# Patient Record
Sex: Male | Born: 1965 | Race: White | Hispanic: No | Smoking: Never smoker
Health system: Southern US, Academic
[De-identification: ages and names within clinical notes are randomized; demographics above are authoritative.]

## PROBLEM LIST (undated history)

## (undated) DIAGNOSIS — G4730 Sleep apnea, unspecified: Secondary | ICD-10-CM

## (undated) DIAGNOSIS — Z973 Presence of spectacles and contact lenses: Secondary | ICD-10-CM

## (undated) DIAGNOSIS — I10 Essential (primary) hypertension: Secondary | ICD-10-CM

## (undated) DIAGNOSIS — Z77090 Contact with and (suspected) exposure to asbestos: Secondary | ICD-10-CM

## (undated) DIAGNOSIS — F4310 Post-traumatic stress disorder, unspecified: Secondary | ICD-10-CM

## (undated) DIAGNOSIS — Z9989 Dependence on other enabling machines and devices: Secondary | ICD-10-CM

## (undated) DIAGNOSIS — Z8669 Personal history of other diseases of the nervous system and sense organs: Secondary | ICD-10-CM

## (undated) DIAGNOSIS — E119 Type 2 diabetes mellitus without complications: Secondary | ICD-10-CM

## (undated) DIAGNOSIS — R011 Cardiac murmur, unspecified: Secondary | ICD-10-CM

## (undated) DIAGNOSIS — Z87448 Personal history of other diseases of urinary system: Secondary | ICD-10-CM

## (undated) DIAGNOSIS — C801 Malignant (primary) neoplasm, unspecified: Secondary | ICD-10-CM

## (undated) DIAGNOSIS — F419 Anxiety disorder, unspecified: Secondary | ICD-10-CM

## (undated) DIAGNOSIS — E785 Hyperlipidemia, unspecified: Secondary | ICD-10-CM

## (undated) HISTORY — DX: Post-traumatic stress disorder, unspecified: F43.10

## (undated) HISTORY — DX: Type 2 diabetes mellitus without complications: E11.9

## (undated) HISTORY — PX: HX CARPAL TUNNEL RELEASE: SHX101

## (undated) HISTORY — DX: Contact with and (suspected) exposure to asbestos: Z77.090

## (undated) HISTORY — PX: HX SHOULDER SURGERY: 2100001311

## (undated) HISTORY — DX: Essential (primary) hypertension: I10

## (undated) HISTORY — PX: KNEE SURGERY: SHX244

## (undated) HISTORY — PX: TESTICLE SURGERY: SHX794

## (undated) HISTORY — DX: Anxiety disorder, unspecified: F41.9

## (undated) HISTORY — PX: PROSTATE BIOPSY: SHX241

---

## 1993-12-08 ENCOUNTER — Emergency Department (HOSPITAL_COMMUNITY): Payer: Self-pay

## 2014-07-19 ENCOUNTER — Ambulatory Visit (INDEPENDENT_AMBULATORY_CARE_PROVIDER_SITE_OTHER): Payer: Self-pay | Admitting: OPHTHALMOLOGY

## 2014-08-02 ENCOUNTER — Ambulatory Visit (INDEPENDENT_AMBULATORY_CARE_PROVIDER_SITE_OTHER): Admitting: OPHTHALMOLOGY

## 2014-08-16 ENCOUNTER — Encounter (INDEPENDENT_AMBULATORY_CARE_PROVIDER_SITE_OTHER): Payer: Self-pay | Admitting: OPHTHALMOLOGY

## 2014-08-16 ENCOUNTER — Ambulatory Visit
Admission: RE | Admit: 2014-08-16 | Discharge: 2014-08-16 | Disposition: A | Discharge status: Home / Self Care | Payer: No Typology Code available for payment source | Source: Ambulatory Visit | Attending: Ophthalmology | Admitting: Ophthalmology

## 2014-08-16 ENCOUNTER — Ambulatory Visit (HOSPITAL_BASED_OUTPATIENT_CLINIC_OR_DEPARTMENT_OTHER): Payer: No Typology Code available for payment source | Admitting: OPHTHALMOLOGY

## 2014-08-16 DIAGNOSIS — H3589 Other specified retinal disorders: Secondary | ICD-10-CM | POA: Insufficient documentation

## 2014-08-16 DIAGNOSIS — S0510XA Contusion of eyeball and orbital tissues, unspecified eye, initial encounter: Secondary | ICD-10-CM | POA: Insufficient documentation

## 2014-08-16 DIAGNOSIS — H538 Other visual disturbances: Secondary | ICD-10-CM | POA: Insufficient documentation

## 2014-08-16 DIAGNOSIS — S0590XA Unspecified injury of unspecified eye and orbit, initial encounter: Secondary | ICD-10-CM

## 2014-08-16 DIAGNOSIS — H359 Unspecified retinal disorder: Secondary | ICD-10-CM

## 2014-08-16 DIAGNOSIS — I10 Essential (primary) hypertension: Secondary | ICD-10-CM | POA: Insufficient documentation
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
## 2014-08-19 ENCOUNTER — Ambulatory Visit (INDEPENDENT_AMBULATORY_CARE_PROVIDER_SITE_OTHER): Payer: Self-pay | Admitting: OPHTHALMOLOGY

## 2014-08-19 NOTE — Telephone Encounter (Signed)
Faxed chart note to [facility] regarding this pt  [phone]

## 2014-08-19 NOTE — Telephone Encounter (Signed)
-----   Message from Jones Bales sent at 08/18/2014 10:55 AM EDT -----  >> STEPHANIE GAMBLE 08/18/2014 10:55 AM  Dr Camillo Flaming pt                  Asher Muir states she sisnt get any paperwork back on this pt and needs to know what happened at  The appt on 8.31.15 please call

## 2015-03-18 ENCOUNTER — Ambulatory Visit (INDEPENDENT_AMBULATORY_CARE_PROVIDER_SITE_OTHER): Payer: Self-pay

## 2015-04-01 ENCOUNTER — Ambulatory Visit (INDEPENDENT_AMBULATORY_CARE_PROVIDER_SITE_OTHER): Payer: No Typology Code available for payment source

## 2015-06-10 ENCOUNTER — Ambulatory Visit (HOSPITAL_BASED_OUTPATIENT_CLINIC_OR_DEPARTMENT_OTHER): Payer: No Typology Code available for payment source

## 2015-06-10 ENCOUNTER — Ambulatory Visit
Admission: RE | Admit: 2015-06-10 | Discharge: 2015-06-10 | Disposition: A | Discharge status: Home / Self Care | Payer: No Typology Code available for payment source | Source: Ambulatory Visit

## 2015-06-10 ENCOUNTER — Encounter (INDEPENDENT_AMBULATORY_CARE_PROVIDER_SITE_OTHER): Payer: Self-pay

## 2015-06-10 ENCOUNTER — Ambulatory Visit (HOSPITAL_BASED_OUTPATIENT_CLINIC_OR_DEPARTMENT_OTHER)
Admission: RE | Admit: 2015-06-10 | Discharge: 2015-06-10 | Disposition: A | Discharge status: Home / Self Care | Payer: No Typology Code available for payment source | Source: Ambulatory Visit

## 2015-06-10 DIAGNOSIS — H4011X1 Primary open-angle glaucoma, mild stage: Secondary | ICD-10-CM

## 2015-06-10 DIAGNOSIS — Z87891 Personal history of nicotine dependence: Secondary | ICD-10-CM | POA: Insufficient documentation

## 2015-06-10 DIAGNOSIS — H40003 Preglaucoma, unspecified, bilateral: Secondary | ICD-10-CM

## 2015-06-10 DIAGNOSIS — I10 Essential (primary) hypertension: Secondary | ICD-10-CM | POA: Insufficient documentation

## 2015-06-10 DIAGNOSIS — H543 Unqualified visual loss, both eyes: Secondary | ICD-10-CM | POA: Insufficient documentation

## 2015-06-10 DIAGNOSIS — E119 Type 2 diabetes mellitus without complications: Secondary | ICD-10-CM | POA: Insufficient documentation

## 2015-06-10 MED ORDER — LATANOPROST 0.005 % EYE DROPS
1.00 [drp] | Freq: Every evening | OPHTHALMIC | Status: DC
Start: 2015-06-10 — End: 2023-04-01

## 2015-06-10 NOTE — Progress Notes (Addendum)
[facility]  Operated by [facility]  [address]  [place] [address]  Dept: [phone]    Patient Name: Christian Williamson  MRN#: [id number]  Birthdate: [birth year]    Date of Service: 06/10/2015    Chief Complaint     Decreased Vision          Christian Williamson is a 49 y.o. male who presents today for evaluation/consultation of:  HPI     Decreased Vision   In both eyes.           Comments   Pt states that he has intermittent blurry vision OS is worse. Pt states that he has been having high IOP and pain/pressure behind his eyes, OS worse. Pt states that her has a lot of floaters OS. Pt states that he is going to be getting new glasses but waiting till his BS get under control again. Family history of glaucoma- his mother and she had to use drops. Father was a diabetic and lost vision.        Last edited by Flavia Shipper, COA on 06/10/2015  9:22 AM. (History)        ROS     Positive for: Endocrine (Diabetes), Eyes (decreased vision)    Negative for: Constitutional, Gastrointestinal, Neurological, Skin, Genitourinary, Musculoskeletal, HENT, Cardiovascular, Respiratory, Psychiatric, Allergic/Imm, Heme/Lymph    Last edited by Flavia Shipper, COA on 06/10/2015  9:22 AM. (History)           Flavia Shipper, COA 06/10/2015, 09:28   MD Addition to HPI: Christian Williamson is here for a glaucoma evaluation.  He says there is a family history of glaucoma and diabetes.  He complains of blurry vision in his left eye that tends to get worse.  He also states he has high eye pressure behind his eye and seeing floaters, but the left eye is worse.      History reviewed. No pertinent past surgical history.        Past Medical History   Diagnosis Date    HTN (hypertension)     Diabetes            There is no problem list on file for this patient.      Family History:  Family History   Problem Relation Age of Onset    Glaucoma Mother     Diabetes Father     Blindness Father            Social History:     History        Social History    Marital Status: Divorced     Spouse Name: N/A    Number of Children: N/A    Years of Education: N/A     Social History Main Topics    Smoking status: Former Smoker    Smokeless tobacco: Never Used    Alcohol Use: No    Drug Use: Not on file    Sexual Activity: Not on file     Other Topics Concern    Not on file     Social History Narrative        History   Substance Use Topics    Smoking status: Former Smoker    Smokeless tobacco: Never Used    Alcohol Use: No            My Assessment:    ICD-10-CM    1. Glaucoma suspect, bilateral H40.003 OPH 3 ISOPTERS VF  OPH RNFL BI       Ophthalmic Plan of Care:  1. Glaucoma Suspect-  OS with questionable early glaucoma will add xalatan OS QPM.      I discussed the above diagnoses listed in the assessment and the above ophthalmic plan of care with the patient and patient's family.    Follow up:    I have asked Christian Williamson to follow up in 4-6 weeks IOP check         Documented chief complaint, history of present illness, allergies, review of systems, past medical, past surgical, family and social history were reviewed by me and when necessary I made changes to the technician note.  I also reviewed and agree with the findings documented in ophthalmology exam tab.  All questions were answered.      Orders Placed This Encounter   Procedures    OPH 3 ISOPTERS VF    OPH RNFL BI       No orders of the defined types were placed in this encounter.       I scribed a portion of the encounter including Chief Complaint, HPI, Impression, Plan, and exam elements excluding visual acuity, lensometry, pupil assessment, confrontational visual fields, tonometry results, extraocular motility assessment, refractometry results, and assessment of mood and orientation. I scribed this note at the request of the physician who personally performed the services documented.  Sula Rumple, SCRIBE 06/10/2015, 09:33      I have reviewed and confirmed the ROS, PFSH, and exam  elements performed and documented by the technician. The scribed portion of the progress note was scribed on my behalf and at my direction. I have reviewed and attest to the accuracy of the note.   Rolm Bookbinder, MD 06/10/2015, 10:46

## 2019-12-08 ENCOUNTER — Other Ambulatory Visit (HOSPITAL_COMMUNITY): Payer: Self-pay

## 2019-12-08 LAB — EXTERNAL COVID-19 MOLECULAR RESULT: External 2019-n-CoV/SARS-CoV-2: POSITIVE — AB

## 2020-05-12 ENCOUNTER — Other Ambulatory Visit (HOSPITAL_COMMUNITY): Payer: Self-pay

## 2020-05-12 DIAGNOSIS — R109 Unspecified abdominal pain: Secondary | ICD-10-CM

## 2020-06-10 ENCOUNTER — Other Ambulatory Visit: Payer: Self-pay

## 2020-06-10 ENCOUNTER — Ambulatory Visit
Admission: RE | Admit: 2020-06-10 | Discharge: 2020-06-10 | Disposition: A | Discharge status: Home / Self Care | Payer: No Typology Code available for payment source | Source: Ambulatory Visit

## 2020-06-10 DIAGNOSIS — R109 Unspecified abdominal pain: Secondary | ICD-10-CM

## 2023-02-05 ENCOUNTER — Encounter (INDEPENDENT_AMBULATORY_CARE_PROVIDER_SITE_OTHER): Payer: Self-pay | Admitting: Surgery

## 2023-02-12 ENCOUNTER — Encounter (INDEPENDENT_AMBULATORY_CARE_PROVIDER_SITE_OTHER): Payer: Self-pay | Admitting: Surgery

## 2023-02-12 ENCOUNTER — Other Ambulatory Visit: Payer: Self-pay

## 2023-02-12 ENCOUNTER — Ambulatory Visit (INDEPENDENT_AMBULATORY_CARE_PROVIDER_SITE_OTHER): Payer: 59 | Admitting: Surgery

## 2023-02-12 VITALS — BP 128/88 | HR 77 | Temp 98.2°F | Ht 66.0 in | Wt 178.8 lb

## 2023-02-12 DIAGNOSIS — K921 Melena: Secondary | ICD-10-CM

## 2023-02-12 MED ORDER — PEG 3350-ELECTROLYTES 236 GRAM-22.74 GRAM-6.74 GRAM-5.86 GRAM SOLUTION
4.0000 L | Freq: Once | ORAL | 0 refills | Status: DC
Start: 2023-02-12 — End: 2023-02-25

## 2023-02-12 NOTE — Progress Notes (Signed)
GENERAL SURGERY, [facility] GENERAL SURGERY  [address]  [place] [address]    History and Physical     Name: Christian Williamson MRN:  [id number]   Date: 02/12/2023 Age: 57 y.o.            Reason for Visit: Colonoscopy and EGD    History of Present Illness  Mr. Christian Williamson presents today for colonoscopy because of blood in stool.  The patient had a stool study performed which showed a positive fit test.  The patient denies any epigastric pain or reflux.  He states he used to take Nexium but no longer needs to and has had no problems with heartburn, indigestion or nausea.    Positive diabetes   Negative blood thinner    Of note is the fact that the patient also has elevated PSA for which he was seeing Urology.      Review of the result(s) of each unique test:  Patient underwent diagnostic testing ( none ) prior to this dates visit.  I have personally reviewed the results and that serves as a component of the medical decision making for this encounter       Review of prior external note(s) from each unique source:  Patients referral to this office including a recent assessment by the referring provider.  This was reviewed by me for this unique office visit for the indication and intent of the referral as well as any pertinent medical or surgical history relevant to the patients independent evaluation by me today.      Patient History  Past Medical History:   Diagnosis Date    Anxiety     Diabetes (CMS HCC)     Diabetes mellitus, type 2 (CMS HCC)     HTN (hypertension)     PTSD (post-traumatic stress disorder)          Past Surgical History:   Procedure Laterality Date    KNEE SURGERY      TESTICLE SURGERY           Current Outpatient Medications   Medication Sig    busPIRone (BUSPAR) 10 mg Oral Tablet Take 1 Tablet (10 mg total) by mouth Twice daily    empagliflozin (JARDIANCE) 25 mg Oral Tablet Take 1 Tablet (25 mg total) by mouth Once a day    gemfibrozil (LOPID) 600 mg Oral Tablet Take 1 Tablet  (600 mg total) by mouth Twice a day before meals    hydrochlorothiazide (MICROZIDE) 12.5 mg Oral Capsule Take 1 Capsule (12.5 mg total) by mouth Once a day    latanoprost (XALATAN) 0.005 % Ophthalmic Drops Instill 1 Drop into left eye Every evening    lisinopriL (PRINIVIL) 20 mg Oral Tablet Take 1 Tablet (20 mg total) by mouth Once a day    MetFORMIN (GLUCOPHAGE) 1,000 mg Oral Tablet Take 1 Tablet (1,000 mg total) by mouth Twice daily    metoprolol succinate (TOPROL-XL) 100 mg Oral Tablet Sustained Release 24 hr Take 1 Tablet (100 mg total) by mouth Once a day    mirtazapine (REMERON) 15 mg Oral Tablet 1 Tablet (15 mg total)    omeprazole (PRILOSEC) 20 mg Oral Capsule, Delayed Release(E.C.) Take 1 Capsule (20 mg total) by mouth Once a day    PEG 3350-Electrolytes 236-22.74-6.74 -5.86 gram Oral Recon Soln Take 4,000 mL by mouth One time for 1 dose    prazosin (MINIPRESS) 1 mg Oral Capsule 1 Capsule (1 mg total)    ranitidine (  ZANTAC) 15 mg/mL Oral Syrup Take 150 mg by mouth Twice daily (Patient not taking: Reported on 02/12/2023)    tamsulosin (FLOMAX) 0.4 mg Oral Capsule 1 Capsule (0.4 mg total)    Venlafaxine (EFFEXOR) 100 mg Oral Tablet Take 1 Tablet (100 mg total) by mouth Three times a day     No Known Allergies  Family Medical History:       Problem Relation (Age of Onset)    Blindness Father    Cancer Other    Diabetes Father, Other    Glaucoma Mother            Social History     Tobacco Use    Smoking status: Former    Smokeless tobacco: Never   Substance Use Topics    Alcohol use: No    Drug use: Never            Physical Examination:  Vitals:    02/12/23 1453   BP: 128/88   Pulse: 77   Temp: 36.8 C (98.2 F)   Weight: 81.1 kg (178 lb 12.8 oz)   Height: 1.676 m ('5\' 6"'$ )   BMI: 28.92        General: appropriate for age. in no acute distress.    Vital signs are present above and have been reviewed by me     HEENT: Atraumatic, Normocephalic. PERRLA. EOMI. Nose clear. Throat clear    Lungs: Nonlabored breathing  with symmetric expansion. Clear to auscultation bilaterally    Heart:Regular wth respect to rate and rythmn.    Abdomen:Soft. Nontender. Nondistended and benign    Extremities: Grossly normal. No major deformities     Neuro:  Grossly normal motor and sensory function    Psychiatric: Alert and oriented to person, place, and time. affect appropriate      Assessment and Plan  Colonoscopy because of blood in stool/positive fit test scheduled for 02/25/2023 at 9:00 a.m.      Follow Up:  No follow-ups on file.      ICD-10-CM    1. Blood in stool  K92.1           Brees Hounshell B Shataria Crist, MD ,MBA,FACS    I appreciate the opportunity to be involved in the care of your patients.  If you have any questions or concerns regarding this encounter, please do not hesitate to contact me at your convenience.      This note may have been partially generated using MModal Fluency Direct system, and there may be some incorrect words, spellings, and punctuation that were not noted in checking the note before saving, though effort was made to avoid such errors.

## 2023-02-15 ENCOUNTER — Other Ambulatory Visit: Payer: Self-pay

## 2023-02-15 ENCOUNTER — Ambulatory Visit (INDEPENDENT_AMBULATORY_CARE_PROVIDER_SITE_OTHER): Payer: 59 | Admitting: UROLOGY

## 2023-02-15 ENCOUNTER — Encounter (INDEPENDENT_AMBULATORY_CARE_PROVIDER_SITE_OTHER): Payer: Self-pay | Admitting: UROLOGY

## 2023-02-15 VITALS — BP 134/77 | HR 75 | Ht 66.0 in | Wt 179.0 lb

## 2023-02-15 DIAGNOSIS — R972 Elevated prostate specific antigen [PSA]: Secondary | ICD-10-CM

## 2023-02-15 DIAGNOSIS — N401 Enlarged prostate with lower urinary tract symptoms: Secondary | ICD-10-CM

## 2023-02-15 DIAGNOSIS — R3911 Hesitancy of micturition: Secondary | ICD-10-CM

## 2023-02-15 NOTE — Progress Notes (Signed)
[facility]                                                                        Urology Clinic       Williamson, Christian Scholze, 57 y.o. male  Encounter Start Date:  (Not on file)  Date of Service:  02/15/2023  Date of Birth:  June 09, 1966    PCP: Trevor Mace, MD.    Information Obtained from: patient  Chief Complaint:  Elevated PSA       HPI:    [name] is a 57 y.o., White male who presents with elevated PSA seen at the request of his primary care physician.  Patient does use Flomax for BPH symptoms.  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 [facility] documents  2. Discontinue Flomax for a trial he states it has not helped him when he has missed a dose  3. Obtain an MRI of the prostate with basic metabolic panel  4. Follow up in 3-4 months for re-evaluation          [name], DO

## 2023-02-25 ENCOUNTER — Ambulatory Visit (HOSPITAL_COMMUNITY): Payer: 59 | Admitting: Surgery

## 2023-02-25 ENCOUNTER — Encounter (HOSPITAL_COMMUNITY)
Admission: RE | Disposition: A | Discharge status: Home / Self Care | Payer: Self-pay | Source: Home / Self Care | Attending: Surgery

## 2023-02-25 ENCOUNTER — Other Ambulatory Visit: Payer: Self-pay

## 2023-02-25 ENCOUNTER — Ambulatory Visit
Admission: RE | Admit: 2023-02-25 | Discharge: 2023-02-25 | Disposition: A | Discharge status: Home / Self Care | Payer: 59 | Attending: Surgery | Admitting: Surgery

## 2023-02-25 ENCOUNTER — Ambulatory Visit (HOSPITAL_COMMUNITY): Payer: 59 | Admitting: Certified Registered"

## 2023-02-25 ENCOUNTER — Encounter (HOSPITAL_COMMUNITY): Payer: Self-pay | Admitting: Surgery

## 2023-02-25 DIAGNOSIS — G4730 Sleep apnea, unspecified: Secondary | ICD-10-CM | POA: Insufficient documentation

## 2023-02-25 DIAGNOSIS — F419 Anxiety disorder, unspecified: Secondary | ICD-10-CM | POA: Insufficient documentation

## 2023-02-25 DIAGNOSIS — E119 Type 2 diabetes mellitus without complications: Secondary | ICD-10-CM | POA: Insufficient documentation

## 2023-02-25 DIAGNOSIS — I10 Essential (primary) hypertension: Secondary | ICD-10-CM | POA: Insufficient documentation

## 2023-02-25 DIAGNOSIS — K641 Second degree hemorrhoids: Secondary | ICD-10-CM | POA: Insufficient documentation

## 2023-02-25 DIAGNOSIS — K5730 Diverticulosis of large intestine without perforation or abscess without bleeding: Secondary | ICD-10-CM | POA: Insufficient documentation

## 2023-02-25 DIAGNOSIS — Z87891 Personal history of nicotine dependence: Secondary | ICD-10-CM | POA: Insufficient documentation

## 2023-02-25 DIAGNOSIS — F4310 Post-traumatic stress disorder, unspecified: Secondary | ICD-10-CM | POA: Insufficient documentation

## 2023-02-25 DIAGNOSIS — K921 Melena: Secondary | ICD-10-CM | POA: Insufficient documentation

## 2023-02-25 SURGERY — COLONOSCOPY
Anesthesia: General | Wound class: Clean Contaminated Wounds-The respiratory, GI, Genital, or urinary

## 2023-02-25 MED ORDER — PROPOFOL 10 MG/ML INTRAVENOUS EMULSION
Freq: Once | INTRAVENOUS | Status: DC | PRN
Start: 2023-02-25 — End: 2023-02-25
  Administered 2023-02-25: 30 mL via INTRAVENOUS

## 2023-02-25 MED ORDER — DEXTROSE 5 % AND LACTATED RINGERS INTRAVENOUS SOLUTION
INTRAVENOUS | Status: DC | PRN
Start: 2023-02-25 — End: 2023-02-25

## 2023-02-25 NOTE — Discharge Instructions (Addendum)
SURGICAL DISCHARGE INSTRUCTIONS     Dr. Renette Butters, Gene B, MD  performed your COLONOSCOPY today at the [facility]:  Monday through Friday from 8 a.m. - 4 p.m.: [phone]    For T&D: [phone]  Between 4 p.m. - 8 a.m., weekends and holidays:  Call ER [phone]    PLEASE SEE WRITTEN HANDOUTS AS DISCUSSED BY YOUR NURSE    SIGNS AND SYMPTOMS OF A WOUND / INCISION INFECTION   Be sure to watch for the following:  Increase in redness or red streaks near or around the wound or incision.  Increase in pain that is intense or severe and cannot be relieved by the pain medication that your doctor has given you.  Increase in swelling that cannot be relieved by elevation of a body part, or by applying ice, if permitted.  Increase in drainage, or if yellow / green in color and smells bad. This could be on a dressing or a cast.  Increase in fever for longer than 24 hours, or an increase that is higher than 101 degrees Fahrenheit (normal body temperature is 98 degrees Fahrenheit). The incision may feel warm to the touch.    **CALL YOUR DOCTOR IF ONE OR MORE OF THESE SIGNS / SYMPTOMS SHOULD OCCUR.    ANESTHESIA INFORMATION   ANESTHESIA -- ADULT PATIENTS:  You have received intravenous sedation / general anesthesia, and you may feel drowsy and light-headed for several hours. You may even experience some forgetfulness of the procedure. DO NOT DRIVE A MOTOR VEHICLE or perform any activity requiring complete alertness or coordination until you feel fully awake in about 24-48 hours. Do not drink alcoholic beverages for at least 24 hours. Do not stay alone, you must have a responsible adult available to be with you. You may also experience a dry mouth or nausea for 24 hours. This is a normal side effect and will disappear as the effects of the medication wear off.    REMEMBER   If you experience any difficulty breathing, chest pain, bleeding that you feel is excessive,  persistent nausea or vomiting or for any other concerns:  Call your physician Dr.  Renette Butters, Bruna Potter, MD   at [phone] . You may also ask to have the general doctor on call paged. They are available to you 24 hours a day.      SPECIAL INSTRUCTIONS / COMMENTS   FINDINGS: moderate sigmoid diverticulosis, tethering of sigmoid colon, scattered diverticulosis throughout, grade 2 hemorrhoids     TODAY, NO DRIVING, COOKING, CLEANING OR SIGNING LEGAL DOCUMENTS. RETURN TO NORMAL ACTIVITIES TOMORROW  CONTINUE HOME MEDS AND EAT A REGULAR DIET TODAY      FOLLOW-UP APPOINTMENTS   Please call your surgeon's office at the number listed to schedule a date / time of return for follow-up.     Dr Laqueta Due  [phone]

## 2023-02-25 NOTE — Anesthesia Postprocedure Evaluation (Signed)
Anesthesia Post Op Evaluation    Patient: Christian Williamson  Procedure(s):  COLONOSCOPY    Last Vitals:Temperature: 36.8 C (98.2 F) (02/25/23 1012)  Heart Rate: 74 (02/25/23 1012)  BP (Non-Invasive): (!) 92/58 (02/25/23 1012)  Respiratory Rate: 16 (02/25/23 1012)  SpO2: 100 % (02/25/23 1012)    No notable events documented.    Patient is sufficiently recovered from the effects of anesthesia to participate in the evaluation and has returned to their pre-procedure level.  Patient location during evaluation: PACU       Patient participation: complete - patient participated  Level of consciousness: awake and alert and responsive to verbal stimuli    Pain score: 0  Pain management: adequate  Airway patency: patent    Anesthetic complications: no  Cardiovascular status: acceptable  Respiratory status: acceptable  Hydration status: acceptable  Patient post-procedure temperature: Pt Normothermic   PONV Status: Absent

## 2023-02-25 NOTE — OR Surgeon (Signed)
[facility]      Patient Name: Christian Williamson, Christian Williamson Number: [id number]  Date of Service: 02/25/2023   Date of Birth: 1966-12-06      Pre-Operative Diagnosis: BLOOD IN STOOL     Post-Operative Diagnosis: moderate sigmoid diverticulosis, tethering of sigmoid colon, scattered diverticulosis throughout, grade 2 hemorrhoids    Procedure(s)/Description:  COLONOSCOPY: [id number] (CPT)     Attending Surgeon: Willow Ora, MD     Anesthesia:  CRNA: Carmina Miller, CRNA    Anesthesia Type: .General       The patient indicates that they have read and understood the preoperative colonoscopy consent form. The benefits, risks and alternatives to the procedure were discussed. I specifically discussed the risk of bleeding and/or perforation requiring operation.The patient indicates they have no further question and wish to proceed. Informed consent was obtained from the patient and/or medical power of attorney.    The patient was brought into the procedure room and placed on the table in the left lateral decubitus position. After IV sedation was given, full finger digital rectal examination was performed with a circumferential sweep of the distal rectal mucosa. Subsequently, the flexible colonoscope was inserted into the rectum and passed without any difficulty. The colonoscope was then advanced up into the sigmoid colon, descending colon, transverse colon, right colon and cecum without any difficulty. Gross examination of each section of the colon was performed. Cecal intubation was achieved and the appendiceal orifice and ileocecal valve were identified. The operative findings of diverticulosis were noted as described above. The colonoscope was withdrawn carefully examining the mucosa as the scope was being extracted with particular attention paid to the proximal sides of folds, flexures, bends and rectal valves. At approximately 10 cm. from the anal verge, the colonoscope was retroflexed to fully  examine the distal rectum. The colonoscope was removed and a repeat digital rectal examination was performed at the completion of the procedure. The patient tolerated the procedure well. No intraoperative complications were encountered.    EKG, pulse, pulse oximetry and blood pressure were monitored throughout the entire procedure.    There were no unplanned events.    The patient was instructed to contact me if they have any problems with their colon such as bleeding, pain or changes in bowel habits. They understood and agreed to do so.    The patient will not need another screening colonoscopy for 10 years which is according to ASGE guidelines. However, if in the future the patient has any problems with abdominal pain, changes in bowel habits, blood in stool, etc., then they should contact me because they may be a candidate for diagnostic colonoscopy before the 10 year time limit.      Takeela Peil B. Harper Vandervoort, MD, MBA, [facility]

## 2023-02-25 NOTE — H&P (Signed)
[facility]  General Surgery  History and Physical    Date of Service:  02/25/2023  Christian Williamson, Christian Williamson, 57 y.o. male  Date of Admission:  02/25/2023  Date of Birth:  1966/09/28  PCP: [facility]    Reason for admission:  Colonoscopy    HPI:  Christian Williamson is a 57 y.o. White male who is admitted for BLOOD IN STOOL   Mr. Mcmeans presents today for colonoscopy because of blood in stool.  The patient had a stool study performed which showed a positive fit test.  The patient denies any epigastric pain or reflux.  He states he used to take Nexium but no longer needs to and has had no problems with heartburn, indigestion or nausea.     Positive diabetes   Negative blood thinner     Of note is the fact that the patient also has elevated PSA for which he was seeing Urology.        Review of the result(s) of each unique test:  Patient underwent diagnostic testing ( none ) prior to this dates visit.  I have personally reviewed the results and that serves as a component of the medical decision making for this encounter        Review of prior external note(s) from each unique source:  Patients referral to this office including a recent assessment by the referring provider.  This was reviewed by me for this unique office visit for the indication and intent of the referral as well as any pertinent medical or surgical history relevant to the patients independent evaluation by me today.      Past Medical History:   Diagnosis Date    Anxiety     Diabetes (CMS HCC)     Diabetes mellitus, type 2 (CMS HCC)     HTN (hypertension)     PTSD (post-traumatic stress disorder)       Past Surgical History:   Procedure Laterality Date    KNEE SURGERY      TESTICLE SURGERY        Social History     Tobacco Use    Smoking status: Former    Smokeless tobacco: Never   Substance Use Topics    Alcohol use: No    Drug use: Never       Family Medical History:       Problem Relation (Age of Onset)    Blindness Father     Cancer Other    Diabetes Father, Other    Glaucoma Mother           Medications Prior to Admission       Prescriptions    busPIRone (BUSPAR) 10 mg Oral Tablet    Take 1 Tablet (10 mg total) by mouth Twice daily    empagliflozin (JARDIANCE) 25 mg Oral Tablet    Take 1 Tablet (25 mg total) by mouth Once a day    gemfibrozil (LOPID) 600 mg Oral Tablet    Take 1 Tablet (600 mg total) by mouth Twice a day before meals    hydrochlorothiazide (MICROZIDE) 12.5 mg Oral Capsule    Take 1 Capsule (12.5 mg total) by mouth Once a day    latanoprost (XALATAN) 0.005 % Ophthalmic Drops    Instill 1 Drop into left eye Every evening    lisinopriL (PRINIVIL) 20 mg Oral Tablet    Take 1 Tablet (20 mg total) by mouth Once a day    MetFORMIN (GLUCOPHAGE)  1,000 mg Oral Tablet    Take 1 Tablet (1,000 mg total) by mouth Twice daily    metoprolol succinate (TOPROL-XL) 100 mg Oral Tablet Sustained Release 24 hr    Take 1 Tablet (100 mg total) by mouth Once a day    mirtazapine (REMERON) 15 mg Oral Tablet    1 Tablet (15 mg total)    omeprazole (PRILOSEC) 20 mg Oral Capsule, Delayed Release(E.C.)    Take 1 Capsule (20 mg total) by mouth Once a day    prazosin (MINIPRESS) 1 mg Oral Capsule    1 Capsule (1 mg total)    tamsulosin (FLOMAX) 0.4 mg Oral Capsule    1 Capsule (0.4 mg total)    Venlafaxine (EFFEXOR) 100 mg Oral Tablet    Take 1 Tablet (100 mg total) by mouth Three times a day           No Known Allergies       Patient Vitals for the past 24 hrs:   BP Temp Pulse Resp SpO2 Height Weight   02/25/23 0700 131/73 36.7 C (98 F) 72 18 99 % 1.651 m ('5\' 5"'$ ) 80.7 kg (178 lb)          General: appropriate for age. in no acute distress.    Vital signs are present above and have been reviewed by me     HEENT: Atraumatic, Normocephalic. PERRLA, EOMI. Nose clear. Throat clear.    Lungs: Nonlabored breathing with symmetric expansion.  Clear to auscultation bilaterally    Heart:Regular wth respect to rate and rythmn.    Abdomen:Soft. Nontender.  Nondistended and benign    Extremities:  Grossly normal with good range of motion and no major deformities.    Neuro:  Grossly normal motor and sensory function. CN's II through XII intact.    Psychiatric: Alert and oriented to person, place, and time. affect appropriate    Laboratory Data:     No results found for any visits on 02/25/23 (from the past 24 hour(s)).    Imaging Studies:    No orders to display        Assessment/Plan:  BLOOD IN STOOL    Colonoscopy scheduled for Monday February 25, 2023    Discussed indications, risks, and benefits of colonoscopy with possible biopsy/polypectomy with the patient.  Discussed the possibility of polypectomy, biopsies, and possible repeat examinations.  Risks include bleeding, sedation risks, possibility of missed diagnosis of polyp or malignancy, and remote possibilities of perforation and death.  All questions were answered, and informed consent was clearly obtained.    This note was partially created using voice recognition software and is inherently subject to errors including those of syntax and "sound alike " substitutions which may escape proof reading. In such instances, original meaning may be extrapolated by contextual derivation.    Willow Ora, MD, MBA, FACS

## 2023-02-25 NOTE — Anesthesia Preprocedure Evaluation (Signed)
ANESTHESIA PRE-OP EVALUATION  Planned Procedure: COLONOSCOPY  Review of Systems     anesthesia history negative               Pulmonary   sleep apnea, CPAP and past history of smoking ,   Cardiovascular    Hypertension ,No peripheral edema,  Exercise Tolerance: > or = 4 METS   ,beta blocker therapy  ,taken in last 24 hours     GI/Hepatic/Renal   negative GI/hepatic/renal ROS,         Endo/Other    no obesity   type 2 diabetes    Neuro/Psych/MS    PTSD, anxiety     Cancer    negative hematology/oncology ROS,                     Physical Assessment      Airway       Mallampati: III    TM distance: >3 FB    Neck ROM: full  Mouth Opening: fair.  Facial hair  Beard        Dental           (+) poor dentition           Pulmonary    Breath sounds clear to auscultation  (-) no rhonchi, no decreased breath sounds, no wheezes, no rales and no stridor     Cardiovascular    Rhythm: regular  Rate: Normal  (-) no friction rub, carotid bruit is not present, no peripheral edema and no murmur     Other findings              Plan  ASA 2     Planned anesthesia type: general     total intravenous anesthesia                          Anesthetic plan and risks discussed with patient  signed consent obtained          Patient's NPO status is appropriate for Anesthesia.

## 2023-03-01 ENCOUNTER — Ambulatory Visit
Admission: RE | Admit: 2023-03-01 | Discharge: 2023-03-01 | Disposition: A | Discharge status: Home / Self Care | Payer: 59 | Source: Ambulatory Visit | Attending: UROLOGY | Admitting: UROLOGY

## 2023-03-01 ENCOUNTER — Other Ambulatory Visit: Payer: Self-pay

## 2023-03-01 DIAGNOSIS — R972 Elevated prostate specific antigen [PSA]: Secondary | ICD-10-CM | POA: Insufficient documentation

## 2023-03-01 MED ORDER — GADOBUTROL 10 MMOL/10 ML (1 MMOL/ML) INTRAVENOUS SOLUTION
10.0000 mL | INTRAVENOUS | Status: AC
Start: 2023-03-01 — End: 2023-03-01
  Administered 2023-03-01: 8 mL via INTRAVENOUS

## 2023-03-04 DIAGNOSIS — R972 Elevated prostate specific antigen [PSA]: Secondary | ICD-10-CM

## 2023-03-14 ENCOUNTER — Encounter (INDEPENDENT_AMBULATORY_CARE_PROVIDER_SITE_OTHER): Payer: Self-pay | Admitting: UROLOGY

## 2023-03-14 ENCOUNTER — Other Ambulatory Visit (INDEPENDENT_AMBULATORY_CARE_PROVIDER_SITE_OTHER): Payer: Self-pay | Admitting: Student in an Organized Health Care Education/Training Program

## 2023-03-14 ENCOUNTER — Other Ambulatory Visit: Payer: Self-pay

## 2023-03-14 ENCOUNTER — Ambulatory Visit (INDEPENDENT_AMBULATORY_CARE_PROVIDER_SITE_OTHER): Payer: 59 | Admitting: UROLOGY

## 2023-03-14 VITALS — BP 129/68 | HR 73 | Ht 66.0 in | Wt 174.0 lb

## 2023-03-14 DIAGNOSIS — Z01818 Encounter for other preprocedural examination: Secondary | ICD-10-CM

## 2023-03-14 DIAGNOSIS — R972 Elevated prostate specific antigen [PSA]: Secondary | ICD-10-CM

## 2023-03-14 DIAGNOSIS — R935 Abnormal findings on diagnostic imaging of other abdominal regions, including retroperitoneum: Secondary | ICD-10-CM | POA: Insufficient documentation

## 2023-03-14 MED ORDER — CIPROFLOXACIN 500 MG TABLET
500.0000 mg | ORAL_TABLET | Freq: Two times a day (BID) | ORAL | 0 refills | Status: DC
Start: 2023-03-14 — End: 2023-05-01

## 2023-03-14 NOTE — Progress Notes (Signed)
[facility]                                                                        Urology Clinic       Pun, [name], 57 y.o. male  Encounter Start Date:  (Not on file)  Date of Service:  03/14/2023  Date of Birth:  September 15, 1966    PCP: [facility].    Information Obtained from: patient  Chief Complaint:  Elevated PSA       HPI:    Christian Williamson is a 57 y.o., White male who presents with seen for an elevated PSA of 8.9 from the [facility].  His prior PSA was approximately 3 ng.  Patient discontinued his Flomax stated it did not help him anyway.  Patient has undergone an MRI of the prostate here for continued evaluation           Impression:     Elevated PSA 8.9  Abnormal MRI PI-RADS 5  32 g prostate      Recommendations:      1. Reviewed MRI peripheral zone a PI-RADS 5 recommend fusion biopsy discussed possible complications bleeding infection needing secondary surgeries and diagnosis of malignancy  2. Preoperative antibiotics and urine culture prior  3. Patient discontinued the Flomax states he is voiding adequately but the Minipress/prednisone does have some BPH characteristics which could be helping him as well  4. scheduled for a fusion biopsy follow up for next          [name], DO

## 2023-03-18 ENCOUNTER — Ambulatory Visit (HOSPITAL_BASED_OUTPATIENT_CLINIC_OR_DEPARTMENT_OTHER)
Admission: RE | Admit: 2023-03-18 | Discharge: 2023-03-18 | Disposition: A | Discharge status: Home / Self Care | Payer: 59 | Source: Ambulatory Visit | Attending: Student in an Organized Health Care Education/Training Program | Admitting: Student in an Organized Health Care Education/Training Program

## 2023-03-18 ENCOUNTER — Other Ambulatory Visit: Payer: Self-pay

## 2023-03-18 ENCOUNTER — Ambulatory Visit: Payer: 59 | Attending: Student in an Organized Health Care Education/Training Program

## 2023-03-18 DIAGNOSIS — R972 Elevated prostate specific antigen [PSA]: Secondary | ICD-10-CM | POA: Insufficient documentation

## 2023-03-18 DIAGNOSIS — Z01818 Encounter for other preprocedural examination: Secondary | ICD-10-CM | POA: Insufficient documentation

## 2023-03-18 LAB — BASIC METABOLIC PANEL
ANION GAP: 7 mmol/L (ref 4–13)
BUN/CREA RATIO: 14 (ref 6–22)
BUN: 13 mg/dL (ref 7–25)
CALCIUM: 9.1 mg/dL (ref 8.6–10.3)
CHLORIDE: 105 mmol/L (ref 98–107)
CO2 TOTAL: 25 mmol/L (ref 21–31)
CREATININE: 0.94 mg/dL (ref 0.60–1.30)
ESTIMATED GFR: 93 mL/min/{1.73_m2} (ref >59)
GLUCOSE: 122 mg/dL — ABNORMAL HIGH (ref 74–109)
OSMOLALITY, CALCULATED: 275 mOsm/kg (ref 270–290)
POTASSIUM: 3.6 mmol/L (ref 3.5–5.1)
SODIUM: 137 mmol/L (ref 136–145)

## 2023-03-18 LAB — URINALYSIS, MACROSCOPIC
BILIRUBIN: NEGATIVE mg/dL
BLOOD: NEGATIVE mg/dL
GLUCOSE: 300 mg/dL — AB
KETONES: NEGATIVE mg/dL
LEUKOCYTES: NEGATIVE WBCs/uL
NITRITE: NEGATIVE
PH: 6 (ref 5.0–9.0)
PROTEIN: NEGATIVE mg/dL
SPECIFIC GRAVITY: 1.02 (ref 1.002–1.030)
UROBILINOGEN: NORMAL mg/dL

## 2023-03-18 LAB — URINALYSIS, MICROSCOPIC
RBCS: <1 /hpf (ref <4)
SQUAMOUS EPITHELIAL: <1 /hpf (ref <28)

## 2023-03-18 LAB — CBC
HCT: 41.5 % (ref 36.7–47.1)
HGB: 14.5 g/dL (ref 12.5–16.3)
MCH: 28.2 pg (ref 23.8–33.4)
MCHC: 34.9 g/dL (ref 32.5–36.3)
MCV: 81 fL (ref 73.0–96.2)
MPV: 9.5 fL (ref 7.4–11.4)
PLATELETS: 261 10*3/uL (ref 140–440)
RBC: 5.13 10*6/uL (ref 4.06–5.63)
RDW: 13.5 % (ref 12.1–16.2)
WBC: 7.4 10*3/uL (ref 3.6–10.2)

## 2023-03-19 DIAGNOSIS — Z01810 Encounter for preprocedural cardiovascular examination: Secondary | ICD-10-CM

## 2023-03-19 LAB — ECG 12 LEAD
Atrial Rate: 82 {beats}/min
Calculated P Axis: 3 degrees
Calculated R Axis: 63 degrees
Calculated T Axis: 32 degrees
PR Interval: 128 ms
QRS Duration: 88 ms
QT Interval: 384 ms
QTC Calculation: 448 ms
Ventricular rate: 82 {beats}/min

## 2023-04-01 ENCOUNTER — Encounter (HOSPITAL_COMMUNITY)
Admission: RE | Disposition: A | Discharge status: Home / Self Care | Payer: Self-pay | Source: Ambulatory Visit | Attending: Student in an Organized Health Care Education/Training Program

## 2023-04-01 ENCOUNTER — Ambulatory Visit (HOSPITAL_COMMUNITY): Payer: 59 | Admitting: Anesthesiology

## 2023-04-01 ENCOUNTER — Inpatient Hospital Stay
Admission: RE | Admit: 2023-04-01 | Discharge: 2023-04-01 | Disposition: A | Discharge status: Home / Self Care | Payer: 59 | Source: Ambulatory Visit | Attending: Student in an Organized Health Care Education/Training Program | Admitting: Student in an Organized Health Care Education/Training Program

## 2023-04-01 ENCOUNTER — Other Ambulatory Visit: Payer: Self-pay

## 2023-04-01 ENCOUNTER — Encounter (HOSPITAL_COMMUNITY): Payer: Self-pay | Admitting: Student in an Organized Health Care Education/Training Program

## 2023-04-01 DIAGNOSIS — G4730 Sleep apnea, unspecified: Secondary | ICD-10-CM | POA: Insufficient documentation

## 2023-04-01 DIAGNOSIS — Z79899 Other long term (current) drug therapy: Secondary | ICD-10-CM | POA: Insufficient documentation

## 2023-04-01 DIAGNOSIS — F4310 Post-traumatic stress disorder, unspecified: Secondary | ICD-10-CM | POA: Insufficient documentation

## 2023-04-01 DIAGNOSIS — I10 Essential (primary) hypertension: Secondary | ICD-10-CM | POA: Insufficient documentation

## 2023-04-01 DIAGNOSIS — Z87891 Personal history of nicotine dependence: Secondary | ICD-10-CM | POA: Insufficient documentation

## 2023-04-01 DIAGNOSIS — C61 Malignant neoplasm of prostate: Secondary | ICD-10-CM | POA: Insufficient documentation

## 2023-04-01 DIAGNOSIS — E119 Type 2 diabetes mellitus without complications: Secondary | ICD-10-CM | POA: Insufficient documentation

## 2023-04-01 DIAGNOSIS — F419 Anxiety disorder, unspecified: Secondary | ICD-10-CM | POA: Insufficient documentation

## 2023-04-01 LAB — POC BLOOD GLUCOSE (RESULTS): GLUCOSE, POC: 124 mg/dl — ABNORMAL HIGH (ref 70–100)

## 2023-04-01 SURGERY — BIOPSY PROSTATE TRANSURETHRAL
Anesthesia: Monitor Anesthesia Care | Site: Prostate

## 2023-04-01 MED ORDER — LACTATED RINGERS INTRAVENOUS SOLUTION
INTRAVENOUS | Status: DC
Start: 2023-04-01 — End: 2023-04-01

## 2023-04-01 MED ORDER — FENTANYL (PF) 50 MCG/ML INJECTION WRAPPER
25.0000 ug | INJECTION | INTRAMUSCULAR | Status: DC | PRN
Start: 2023-04-01 — End: 2023-04-01

## 2023-04-01 MED ORDER — SODIUM CHLORIDE 0.9 % (FLUSH) INJECTION SYRINGE
3.0000 mL | INJECTION | INTRAMUSCULAR | Status: DC | PRN
Start: 2023-04-01 — End: 2023-04-01

## 2023-04-01 MED ORDER — LIDOCAINE HCL 10 MG/ML (1 %) INJECTION SOLUTION
Freq: Once | INTRAMUSCULAR | Status: DC | PRN
Start: 2023-04-01 — End: 2023-04-01
  Administered 2023-04-01: 2 mL via INTRAMUSCULAR

## 2023-04-01 MED ORDER — ONDANSETRON HCL (PF) 4 MG/2 ML INJECTION SOLUTION
INTRAMUSCULAR | Status: AC
Start: 2023-04-01 — End: 2023-04-01
  Filled 2023-04-01: qty 2

## 2023-04-01 MED ORDER — MIDAZOLAM 5 MG/ML INJECTION WRAPPER
Freq: Once | INTRAMUSCULAR | Status: DC | PRN
Start: 2023-04-01 — End: 2023-04-01
  Administered 2023-04-01 (×2): 2.5 mg via INTRAVENOUS

## 2023-04-01 MED ORDER — ONDANSETRON HCL (PF) 4 MG/2 ML INJECTION SOLUTION
4.0000 mg | Freq: Once | INTRAMUSCULAR | Status: AC
Start: 2023-04-01 — End: 2023-04-01
  Administered 2023-04-01: 4 mg via INTRAVENOUS

## 2023-04-01 MED ORDER — FENTANYL (PF) 50 MCG/ML INJECTION SOLUTION
INTRAMUSCULAR | Status: AC
Start: 2023-04-01 — End: 2023-04-01
  Filled 2023-04-01: qty 2

## 2023-04-01 MED ORDER — ONDANSETRON HCL (PF) 4 MG/2 ML INJECTION SOLUTION
4.0000 mg | Freq: Once | INTRAMUSCULAR | Status: DC | PRN
Start: 2023-04-01 — End: 2023-04-01

## 2023-04-01 MED ORDER — DEXAMETHASONE SODIUM PHOSPHATE 4 MG/ML INJECTION SOLUTION
4.0000 mg | Freq: Once | INTRAMUSCULAR | Status: AC
Start: 2023-04-01 — End: 2023-04-01
  Administered 2023-04-01: 4 mg via INTRAVENOUS

## 2023-04-01 MED ORDER — IPRATROPIUM 0.5 MG-ALBUTEROL 3 MG (2.5 MG BASE)/3 ML NEBULIZATION SOLN
3.0000 mL | INHALATION_SOLUTION | Freq: Once | RESPIRATORY_TRACT | Status: DC | PRN
Start: 2023-04-01 — End: 2023-04-01

## 2023-04-01 MED ORDER — LIDOCAINE (PF) 100 MG/5 ML (2 %) INTRAVENOUS SYRINGE
INJECTION | Freq: Once | INTRAVENOUS | Status: DC | PRN
Start: 2023-04-01 — End: 2023-04-01
  Administered 2023-04-01: 100 mg via INTRAVENOUS

## 2023-04-01 MED ORDER — ONDANSETRON HCL (PF) 4 MG/2 ML INJECTION SOLUTION
4.0000 mg | Freq: Three times a day (TID) | INTRAMUSCULAR | Status: DC | PRN
Start: 2023-04-01 — End: 2023-04-01

## 2023-04-01 MED ORDER — HYDROCODONE 5 MG-ACETAMINOPHEN 325 MG TABLET
1.0000 | ORAL_TABLET | ORAL | Status: DC | PRN
Start: 2023-04-01 — End: 2023-04-01

## 2023-04-01 MED ORDER — SODIUM CHLORIDE 0.9 % (FLUSH) INJECTION SYRINGE
3.0000 mL | INJECTION | Freq: Three times a day (TID) | INTRAMUSCULAR | Status: DC
Start: 2023-04-01 — End: 2023-04-01

## 2023-04-01 MED ORDER — GENTAMICIN IV - PHARMACIST TO DOSE PER PROTOCOL
Freq: Every day | Status: DC | PRN
Start: 2023-04-01 — End: 2023-04-01

## 2023-04-01 MED ORDER — MIDAZOLAM 5 MG/ML INJECTION WRAPPER
INTRAMUSCULAR | Status: AC
Start: 2023-04-01 — End: 2023-04-01
  Filled 2023-04-01: qty 1

## 2023-04-01 MED ORDER — PROPOFOL 10 MG/ML IV BOLUS
INJECTION | Freq: Once | INTRAVENOUS | Status: DC | PRN
Start: 2023-04-01 — End: 2023-04-01
  Administered 2023-04-01 (×2): 20 mg via INTRAVENOUS

## 2023-04-01 MED ORDER — FAMOTIDINE (PF) 20 MG/2 ML INTRAVENOUS SOLUTION
20.0000 mg | Freq: Once | INTRAVENOUS | Status: AC
Start: 2023-04-01 — End: 2023-04-01
  Administered 2023-04-01: 20 mg via INTRAVENOUS

## 2023-04-01 MED ORDER — SODIUM CHLORIDE 0.9 % INTRAVENOUS SOLUTION
5.0000 mg/kg | Freq: Once | INTRAVENOUS | Status: DC
Start: 2023-04-01 — End: 2023-04-01
  Filled 2023-04-01: qty 10

## 2023-04-01 MED ORDER — FENTANYL (PF) 50 MCG/ML INJECTION WRAPPER
50.0000 ug | INJECTION | INTRAMUSCULAR | Status: DC | PRN
Start: 2023-04-01 — End: 2023-04-01

## 2023-04-01 MED ORDER — MIDAZOLAM 5 MG/ML INJECTION WRAPPER
1.0000 mg | Freq: Once | INTRAMUSCULAR | Status: DC | PRN
Start: 2023-04-01 — End: 2023-04-01
  Administered 2023-04-01: 1 mg via INTRAVENOUS

## 2023-04-01 MED ORDER — ALBUTEROL SULFATE 2.5 MG/3 ML (0.083 %) SOLUTION FOR NEBULIZATION
2.5000 mg | INHALATION_SOLUTION | Freq: Once | RESPIRATORY_TRACT | Status: DC | PRN
Start: 2023-04-01 — End: 2023-04-01

## 2023-04-01 MED ORDER — MORPHINE 4 MG/ML INJECTION WRAPPER
1.0000 mg | INJECTION | INTRAMUSCULAR | Status: DC | PRN
Start: 2023-04-01 — End: 2023-04-01

## 2023-04-01 MED ORDER — DEXMEDETOMIDINE 100 MCG/ML INTRAVENOUS SOLUTION
INTRAVENOUS | Status: AC
Start: 2023-04-01 — End: 2023-04-01
  Filled 2023-04-01: qty 2

## 2023-04-01 MED ORDER — DEXAMETHASONE SODIUM PHOSPHATE 4 MG/ML INJECTION SOLUTION
INTRAMUSCULAR | Status: AC
Start: 2023-04-01 — End: 2023-04-01
  Filled 2023-04-01: qty 1

## 2023-04-01 MED ORDER — FENTANYL (PF) 50 MCG/ML INJECTION WRAPPER
INJECTION | Freq: Once | INTRAMUSCULAR | Status: DC | PRN
Start: 2023-04-01 — End: 2023-04-01
  Administered 2023-04-01 (×2): 50 ug via INTRAVENOUS

## 2023-04-01 MED ORDER — FAMOTIDINE (PF) 20 MG/2 ML INTRAVENOUS SOLUTION
INTRAVENOUS | Status: AC
Start: 2023-04-01 — End: 2023-04-01
  Filled 2023-04-01: qty 2

## 2023-04-01 MED ORDER — LIDOCAINE HCL 10 MG/ML (1 %) INJECTION SOLUTION
INTRAMUSCULAR | Status: AC
Start: 2023-04-01 — End: 2023-04-01
  Filled 2023-04-01: qty 20

## 2023-04-01 SURGICAL SUPPLY — 29 items
CONTAINR HISTO C90ML 10% NEUT BF FRMLN POLYPROP PREFL 60ML (SPECIMEN COLLECTION SUPPLIES) ×12 IMPLANT
CONV USE 31829 - NEEDLE HYPO  18GA 1.5IN STD REG BVL LF (MED SURG SUPPLIES) ×1 IMPLANT
COVER PROBE 8X1IN NONST LF  ECVT (MED SURG SUPPLIES) ×1
DISCONTINUED USE ITEM 340762 - COVER PROBE 8X1IN NONST LF  ECVT (MED SURG SUPPLIES) ×1 IMPLANT
GLOVE SURG 6 LF  PF BEAD CUF STRL CRM 11.3IN PROTEXIS PLISPRN THK9.1 MIL (GLOVES AND ACCESSORIES) IMPLANT
GLOVE SURG 6 LF  PF SMOOTH BEAD CUF INTLK STRL BLU 11.3IN PROTEXIS NEU-THERA PLISPRN THK7.9 MIL (GLOVES AND ACCESSORIES) IMPLANT
GLOVE SURG 6.5 LF  PF BEAD CUF STRL CRM 11.3IN PROTEXIS PI PLISPRN THK9.1 MIL (GLOVES AND ACCESSORIES) IMPLANT
GLOVE SURG 6.5 LF  PF SMOOTH BEAD CUF INTLK STRL BLU 11.3IN PROTEXIS NEU-THERA PLISPRN THK7.9 MIL (GLOVES AND ACCESSORIES) IMPLANT
GLOVE SURG 6.5 LTX PF SMOOTH BEAD CUF STRL YW 11.5IN PROTEXIS NEU-THERA DDRGL THK8.7 MIL (GLOVES AND ACCESSORIES) IMPLANT
GLOVE SURG 7 LF  PF BEAD CUF STRL CRM 11.8IN PROTEXIS PI PLISPRN THK9.1 MIL (GLOVES AND ACCESSORIES) IMPLANT
GLOVE SURG 7 LF  PF SMOOTH BEAD CUF INTLK STRL BLU 11.8IN PROTEXIS NEU-THERA PLISPRN THK7.9 MIL (GLOVES AND ACCESSORIES) IMPLANT
GLOVE SURG 7 LTX PF SMOOTH BEAD CUF STRL YW 12IN PROTEXIS NEU-THERA DDRGL THK8.7 MIL (GLOVES AND ACCESSORIES) IMPLANT
GLOVE SURG 7.5 LF  PF BEAD CUF STRL CRM 11.8IN PROTEXIS PI PLISPRN THK9.1 MIL (GLOVES AND ACCESSORIES) ×1 IMPLANT
GLOVE SURG 7.5 LF  PF SMOOTH BEAD CUF INTLK STRL BLU 11.8IN PROTEXIS NEU-THERA PLISPRN THK7.9 MIL (GLOVES AND ACCESSORIES) IMPLANT
GLOVE SURG 7.5 LTX PF SMOOTH BEAD CUF STRL YW 12IN PROTEXIS (GLOVES AND ACCESSORIES) IMPLANT
GLOVE SURG 8 LF  PF BEAD CUF STRL CRM 11.8IN PROTEXIS PI PLISPRN THK9.1 MIL (GLOVES AND ACCESSORIES) IMPLANT
GLOVE SURG 8 LF  PF SMOOTH BEAD CUF INTLK STRL BLU 11.8IN PROTEXIS NEU-THERA PLISPRN THK7.9 MIL (GLOVES AND ACCESSORIES) IMPLANT
GLOVE SURG 8.5 LF  PF BEAD CUF STRL CRM 11.8IN PROTEXIS PI PLISPRN THK9.1 MIL (GLOVES AND ACCESSORIES) IMPLANT
GOWN SURG 2XL STD LGTH REG L3 NONREINFORCE BRTHBL TWL STRL LF  DISP BLU HALYARD SPECTRUM SMS (DRAPE/PACKS/SHEETS/OR TOWEL) ×1 IMPLANT
GOWN SURG XL STD LGTH L3 NONREINFORCE HKLP CLSR TWL STRL LF  DISP BLU SPECTRUM SMS (DRAPE/PACKS/SHEETS/OR TOWEL) ×1
GOWN SURG XL STD LGTH L3 NONREINFORCE HKLP CLSR TWL STRL LF DISP BLU SPECTRUM SMS (DRAPE/PACKS/SHEETS/OR TOWEL) ×1 IMPLANT
GUIDE NEEDLE 1.6MM BIOPSY BPL STRL DISP LF  TRANSDUC 8818 8808E (MED SURG SUPPLIES) ×1 IMPLANT
INSTR BIOPSY PNK 25CM 18GA MXCOR 22MM ANG 2 TRGR UL SHRP TIP BVL 13.8CM STRL LF  DISP (MED SURG SUPPLIES) ×1 IMPLANT
LABEL MED CORRECT MED LABELING SYS 4 FLG 2 SHEET 24 PRPRNT STRL (MED SURG SUPPLIES) ×1 IMPLANT
NEEDLE SPINAL BLK 7IN 22GA QUINCKE LONG LGTH REG WL POLYPROP STRL LF  DISP (MED SURG SUPPLIES) ×1 IMPLANT
PAD DRESS 8X3IN MDCHC NONADH NWVN LF  STRL DISP WHT (WOUND CARE SUPPLY) ×1 IMPLANT
SOL IRRG 0.9% NACL 1000ML PLASTIC PR BTL ISTNC N-PYRG STRL LF (MEDICATIONS/SOLUTIONS) ×1 IMPLANT
SYRINGE LL 10ML LF  STRL GRAD N-PYRG DEHP-FR PVC FREE MED DISP (MED SURG SUPPLIES) ×1 IMPLANT
TOWEL 24X16IN COTTON BLU DISP SURG STRL LF (DRAPE/PACKS/SHEETS/OR TOWEL) ×1 IMPLANT

## 2023-04-01 NOTE — OR Surgeon (Signed)
[facility]                                              OPERATIVE NOTE    Patient Name: Christian Williamson, Christian Williamson Number: [id number]  Date of Service: 04/01/2023   Date of Birth: [birth year]    All elements must be documented.    Pre-Operative Diagnosis:ePSA   Post-Operative Diagnosis:same  Procedure(s)/Description:MRI-US fusion transrectal prostate biopsy  Findings: 32cc volume; two PI-RADS 5 lesions posterolateral (bilateral)    Patient was prepped and draped in sterile fashion.  A well lubricated and fire transrectal ultrasound probe was inserted into the rectum.  Using the Uro now software the MRI and ultrasound images were fused together by creating a sweep from the seminal vesicles to the prostatic apex.  Following this there was 2 PI-RADS 5 lesions in the posterior lateral zone bilaterally.  These were biopsied twice on each side for a total of 4 total specimen.  Following this the systematic biopsies were undertaken with 6 from each side.  Following this 1 cc of 1% plain lidocaine was injected at the seminal vesicle tips bilaterally.  Patient tolerated procedure well without untoward event      Attending Surgeon: [name]  Assistant(s): NA    Anesthesia Type: Monitored Anesthesia Care (MAC) / Block  Estimated Blood Loss:  Minimal  Blood Given: NA  Fluids Given: NS  Complications (unintended/unexpected/iatrogenic/accidental/inadvertent events):  NA  Characteristic Event (routinely expected or inherent to the difficulty/nature of the procedure): NA  Did the use of current and/or prior Anticoagulants impact the outcome of the case? No  Wound Class: Clean Contaminated Wounds -Respiratory, GI, Genital, or Urinary    Tubes: None  Drains:  NA  Specimens/ Cultures:  2 biopsies from each region of interest (4 total biopsies), 12 systematic biopsies  Implants: NA           Disposition: PACU - hemodynamically stable.  Condition: stable    Plan:  Follow up next week with Dr.  [place], DO

## 2023-04-01 NOTE — Discharge Instructions (Addendum)
Keep follow up appointment as scheduled    Drink plenty of fluids unless otherwise directed     Call for problems, questions, concerns.    Resume home meds.    Over the counter Tylenol or Ibuprofen for pain unless otherwise directed

## 2023-04-01 NOTE — Anesthesia Postprocedure Evaluation (Signed)
Anesthesia Post Op Evaluation    Patient: Christian Williamson  Procedure(s):  MAGNETIC RESONANCE IMAGING FUSION PROSTATE BIOPSY    Last Vitals:Temperature: 36.1 C (97 F) (04/01/23 0755)  Heart Rate: 70 (04/01/23 0755)  BP (Non-Invasive): 99/63 (04/01/23 0755)  Respiratory Rate: 16 (04/01/23 0755)  SpO2: 100 % (04/01/23 0805)    No notable events documented.    Patient is sufficiently recovered from the effects of anesthesia to participate in the evaluation and has returned to their pre-procedure level.  Patient location during evaluation: PACU       Patient participation: complete - patient participated  Level of consciousness: awake and alert and responsive to verbal stimuli    Pain management: adequate  Airway patency: patent    Anesthetic complications: no  Cardiovascular status: acceptable  Respiratory status: acceptable  Hydration status: acceptable  Patient post-procedure temperature: Pt Normothermic   PONV Status: Absent

## 2023-04-01 NOTE — Anesthesia Preprocedure Evaluation (Signed)
ANESTHESIA PRE-OP EVALUATION  Planned Procedure: MAGNETIC RESONANCE IMAGING FUSION PROSTATE BIOPSY (Prostate)  Review of Systems     anesthesia history negative               Pulmonary   sleep apnea, CPAP and past history of smoking ,   Cardiovascular    Hypertension, ECG reviewed and Pt refuses beta blocker this am ,No peripheral edema,  Exercise Tolerance: > or = 4 METS   ,beta blocker therapy      GI/Hepatic/Renal   negative GI/hepatic/renal ROS,         Endo/Other      type 2 diabetes    Neuro/Psych/MS    PTSD, anxiety     Cancer    negative hematology/oncology ROS,                 Physical Assessment      Airway       Mallampati: III    TM distance: >3 FB    Neck ROM: full  Mouth Opening: fair.  Facial hair  Beard        Dental           (+) poor dentition           Pulmonary    Breath sounds clear to auscultation  (-) no rhonchi, no decreased breath sounds, no wheezes, no rales and no stridor     Cardiovascular    Rhythm: regular  Rate: Normal  (-) no friction rub, carotid bruit is not present, no peripheral edema and no murmur     Other findings            Plan  ASA 3     Planned anesthesia type: MAC                         Intravenous induction     Anesthesia issues/risks discussed are: Dental Injuries, Stroke, Sore Throat and Cardiac Events/MI.  Anesthetic plan and risks discussed with patient  signed consent obtained          Patient's NPO status is appropriate for Anesthesia.

## 2023-04-01 NOTE — Anesthesia Transfer of Care (Signed)
ANESTHESIA TRANSFER OF CARE   Christian Williamson is a 57 y.o. ,male, Weight: 78.5 kg (173 lb)   had Procedure(s):  MAGNETIC RESONANCE IMAGING FUSION PROSTATE BIOPSY  performed  04/01/23   Primary Service: [name], DO    Past Medical History:   Diagnosis Date   . Anxiety    . Diabetes (CMS HCC)    . Diabetes mellitus, type 2 (CMS HCC)    . HTN (hypertension)    . PTSD (post-traumatic stress disorder)       Allergy History as of 04/01/23        No Known Allergies                  I completed my transfer of care / handoff to the receiving personnel during which we discussed:  Access, Airway, All key/critical aspects of case discussed, Analgesia, Antibiotics, Expectation of post procedure, Fluids/Product, Gave opportunity for questions and acknowledgement of understanding, Labs and PMHx  Report given to: Benay Pike, RN    Post Location: PACU                                                           Last OR Temp: Temperature: 36.1 C (97 F)  ABG:  POTASSIUM   Date Value Ref Range Status   03/18/2023 3.6 3.5 - 5.1 mmol/L Final     KETONES   Date Value Ref Range Status   03/18/2023 Negative Negative, Trace mg/dL Final     CALCIUM   Date Value Ref Range Status   03/18/2023 9.1 8.6 - 10.3 mg/dL Final     Calculated P Axis   Date Value Ref Range Status   03/18/2023 3 degrees Final     Calculated R Axis   Date Value Ref Range Status   03/18/2023 63 degrees Final     Calculated T Axis   Date Value Ref Range Status   03/18/2023 32 degrees Final     Airway:* No LDAs found *  Blood pressure 99/63, pulse 70, temperature 36.1 C (97 F), resp. rate 16, height 1.676 m (5\' 6" ), weight 78.5 kg (173 lb), SpO2 100%.

## 2023-04-01 NOTE — Nurses Notes (Signed)
VP x 1 #20 LH unsucessful. VP x 1 #22 LW unsucessful.

## 2023-04-01 NOTE — H&P (Signed)
[facility]  Urology Admission History and Physical      Christian Williamson, Christian Williamson, 57 y.o. male  Encounter Start Date:  04/01/2023  Inpatient Admission Date:    Date of Birth:  01-May-1966    PCP: [facility]    Information Obtained from: patient  Chief Complaint: ePSA       HPI:    vRandy Williamson Stones is a 57 y.o., White male who presents with seen for an elevated PSA of 8.9 from the [facility].  His prior PSA was approximately 3 ng.  Patient discontinued his Flomax stated it did not help him anyway.  Patient has undergone an MRI of the prostate here for continued evaluation        ROS:  MUST comment on all "Abnormal" findings   ROS Other than ROS in the HPI, all other systems were negative.      PAST MEDICAL/ FAMILY/ SOCIAL HISTORY:       Past Medical History:   Diagnosis Date    Anxiety     Diabetes (CMS HCC)     Diabetes mellitus, type 2 (CMS HCC)     HTN (hypertension)     PTSD (post-traumatic stress disorder)          No Known Allergies  Medications Prior to Admission       Prescriptions    busPIRone (BUSPAR) 10 mg Oral Tablet    Take 1 Tablet (10 mg total) by mouth Twice daily    ciprofloxacin HCl (CIPRO) 500 mg Oral Tablet    Take 1 Tablet (500 mg total) by mouth Twice daily Start 24 hours prior to biopsy date    empagliflozin (JARDIANCE) 25 mg Oral Tablet    Take 1 Tablet (25 mg total) by mouth Once a day    gemfibrozil (LOPID) 600 mg Oral Tablet    Take 1 Tablet (600 mg total) by mouth Twice a day before meals    hydrochlorothiazide (MICROZIDE) 12.5 mg Oral Capsule    Take 1 Capsule (12.5 mg total) by mouth Once a day    latanoprost (XALATAN) 0.005 % Ophthalmic Drops    Instill 1 Drop into left eye Every evening    lisinopriL (PRINIVIL) 20 mg Oral Tablet    Take 1 Tablet (20 mg total) by mouth Once a day    MetFORMIN (GLUCOPHAGE) 1,000 mg Oral Tablet    Take 1 Tablet (1,000 mg total) by mouth Twice daily    metoprolol succinate (TOPROL-XL) 100 mg Oral Tablet  Sustained Release 24 hr    Take 1 Tablet (100 mg total) by mouth Once a day    mirtazapine (REMERON) 15 mg Oral Tablet    1 Tablet (15 mg total)    omeprazole (PRILOSEC) 20 mg Oral Capsule, Delayed Release(E.C.)    Take 1 Capsule (20 mg total) by mouth Once a day    prazosin (MINIPRESS) 1 mg Oral Capsule    1 Capsule (1 mg total)    tamsulosin (FLOMAX) 0.4 mg Oral Capsule    1 Capsule (0.4 mg total)    Venlafaxine (EFFEXOR) 100 mg Oral Tablet    Take 1 Tablet (100 mg total) by mouth Three times a day           Gentamicin IV - Pharmacist to Dose per Protocol, , Does not apply, Daily PRN      Past Surgical History:   Procedure Laterality Date    KNEE SURGERY      TESTICLE  SURGERY           Social History     Tobacco Use    Smoking status: Former    Smokeless tobacco: Never   Substance Use Topics    Alcohol use: No    Drug use: Never       Gen: NAD, alert  Pulm: unlabored at rest  CV: palpable pulses  Abd: soft, Nt/ND  GU: no suprapubic tenderness, no CVAT      Assessment & Plan:   There are no active hospital problems to display for this patient.    Elevated PSA with PIRADS 5 lesion  I discussed the pathophysiology and potential causes of PSA elevation, including, but not limited to malignant (prostate adenocarcinoma), benign (prostate enlargement), infectious/inflammatory (prostatitis, urinary tract infection) and iatrogenic/idiopathic (recent ejaculation, instrumentation & age-related change) etiologies. Considering patient's serum PSA within the context of his current age, overall health and desire to identify early a potentially curable malignancy, I would recommend MRI-US fusion prostate needle biopsy  Patient was counseled on the risks of TRUS-PNBx including febrile urinary tract infection with bacteremia requiring hospitalization, prolonged bleeding (hematuria, hematochezia, hematospermia), acute urinary retention, procedural discomfort/pain, anxiety related to the diagnosis as well as limitations of the  currently available biopsy schema including undersampling (false negative, incorrect risk stratification), oversampling (detection of clinically-insignificant cancer), and sampling error (necessity for repeat biopsy).  Patient was instructed on the need for the following:  Interruption of all blood thinning agents (Aspirin, NSAIDs, anticoagulants, antiplatelets) for 7-10 days prior  Prophylactic Levofloxacin 500 mg the day before, the day of, and the day after biopsy          [name], DO

## 2023-04-03 DIAGNOSIS — C61 Malignant neoplasm of prostate: Secondary | ICD-10-CM

## 2023-04-03 LAB — PROSTATE BIOPSY SPECIMENS

## 2023-04-09 ENCOUNTER — Encounter (INDEPENDENT_AMBULATORY_CARE_PROVIDER_SITE_OTHER): Payer: Self-pay | Admitting: UROLOGY

## 2023-04-09 ENCOUNTER — Ambulatory Visit (INDEPENDENT_AMBULATORY_CARE_PROVIDER_SITE_OTHER): Payer: 59 | Admitting: UROLOGY

## 2023-04-09 ENCOUNTER — Other Ambulatory Visit: Payer: Self-pay

## 2023-04-09 VITALS — BP 148/87 | HR 83 | Ht 66.0 in | Wt 175.0 lb

## 2023-04-09 DIAGNOSIS — N529 Male erectile dysfunction, unspecified: Secondary | ICD-10-CM

## 2023-04-09 DIAGNOSIS — R3981 Functional urinary incontinence: Secondary | ICD-10-CM

## 2023-04-09 DIAGNOSIS — C61 Malignant neoplasm of prostate: Secondary | ICD-10-CM

## 2023-04-09 DIAGNOSIS — R9389 Abnormal findings on diagnostic imaging of other specified body structures: Secondary | ICD-10-CM

## 2023-04-09 NOTE — Progress Notes (Signed)
[facility]                                                                        Urology Clinic       Kocak, Hoang Pettingill, 57 y.o. male  Encounter Start Date:  (Not on file)  Date of Service:  04/09/2023  Date of Birth:  [birth year]    PCP: [facility].    Information Obtained from: patient  Chief Complaint:  Elevated PSA post biopsy of the prostate 8.9       HPI:    Isiac Breighner is a 57 y.o., White male who presents with patient was status post prostate biopsy for a PSA of 8.9.  Patient was here for evaluation.  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 Patient risk stratification intermediate unfavorable due to the volume of cores having prostate cancer 8/14 and grade group 2 left apex and MRI inferring extraprostatic extension.  2. Discuss options such as radiation or robotic prostatectomy as well as the complication preoperative postoperative phase and long-term expectations.  Patient has had erectile dysfunction for 20 years and underlying has some baseline incontinence  3. Patient would like to talk to a robotic surgeon for robotic prostatectomy.  4. Follow up in 4 months for re-evaluation          Theodoro Kalata, DO

## 2023-04-10 NOTE — Addendum Note (Signed)
Addended by: Payton Emerald on: 04/10/2023 09:52 AM     Modules accepted: Orders

## 2023-04-11 ENCOUNTER — Ambulatory Visit: Payer: 59 | Attending: Student in an Organized Health Care Education/Training Program

## 2023-04-11 DIAGNOSIS — Z710 Person encountering health services to consult on behalf of another person: Secondary | ICD-10-CM

## 2023-04-12 ENCOUNTER — Telehealth (HOSPITAL_BASED_OUTPATIENT_CLINIC_OR_DEPARTMENT_OTHER): Payer: Self-pay

## 2023-04-12 NOTE — Telephone Encounter (Signed)
LVM for patient to schedule NPV w/ Uro/Onc clinic per referral placed on 04/10/23.

## 2023-04-18 DIAGNOSIS — C61 Malignant neoplasm of prostate: Secondary | ICD-10-CM

## 2023-04-18 LAB — SURGICAL PATHOLOGY CONSULT

## 2023-05-01 ENCOUNTER — Other Ambulatory Visit: Payer: Self-pay

## 2023-05-01 ENCOUNTER — Ambulatory Visit: Payer: 59 | Attending: Urology | Admitting: Urology

## 2023-05-01 DIAGNOSIS — C61 Malignant neoplasm of prostate: Secondary | ICD-10-CM | POA: Insufficient documentation

## 2023-05-01 LAB — PSA, DIAGNOSTIC: PSA: 9.91 ng/mL — ABNORMAL HIGH (ref <=4.00)

## 2023-05-01 NOTE — Nursing Note (Signed)
1405-Patient completed psychosocial distress screening using the Enhanced NCC[employer] Distress Thermometer (DT) Tool and self-reported an overall score of 7. Self-reported scores of 6 and above, including any identified practical, family or emotional concerns, were sent to the [facility] Social Workers' pool for review and management. Any concerns in regards to physical symptoms or treatment decisions were sent to the physician for review via Epic Storyboard. PT sees mental health and denise Child psychotherapist. Link Snuffer, LPN

## 2023-05-01 NOTE — Cancer Center Note (Signed)
[facility]  UROLOGIC ONCOLOGY SURGERY NOTE   CC:   New patient visit for prostate cancer      ASSESSMENT:    57 y.o. male with:    1. Favorable intermediate risk prostate cancer   02/2023 - MRI Prostate [facility]):  32 cc, PI-RADS 5 lesion   03/2023 - MRI fusion TR [facility]): 32 cc, ASAP 5/14 cores, GG1 2/14 cores, GG2 1/14 cores     2. Past medical history includes HTN, DM II, erectile dysfunction   3. Past surgical history includes bilateral knee surgery and colonoscopy (no abdominal surgeries)     PLAN:   1. We reviewed prostate pathology with the patient and his family, a copy of the pathology was given to the patient.   2. We reviewed treatment options for clinically localized prostate cancer including radiation therapy and radical prostatectomy. We engaged in shared decision making with the patient and he would like to proceed with robotic assisted laparoscopic radical prostatectomy (single port).   3. Consent was obtained today.   4. Surgical scheduler was notified to secure date and time for surgery.      SUBJECTIVE:   57 y.o. male presents today for prostate cancer. He has been following with Urology in [place] for elevated PSA. Highest PSA per records from [place] was 8.9, date unknown. He denies any family history of prostate cancer. He denies any GU complaints.     Medical history: Anxiety, depression, PTSD, DM II, HTN, erectile dysfunction    Surgical history: Bilateral knee surgery, colonoscopy    Family history: No family history of cancer   Tobacco: Never smoker   Alcohol: Less than 2 drinks per week   ECOG: 0 - Fully active, asymptomatic   Blood thinners: None      OBJECTIVE:   Current Outpatient Medications   Medication Sig    albuterol sulfate (PROVENTIL OR VENTOLIN OR PROAIR) 90 mcg/actuation Inhalation oral inhaler Take 2 Puffs by inhalation Every 6 hours as needed    busPIRone (BUSPAR) 10 mg Oral Tablet Take 1 Tablet (10 mg total) by mouth Three times a day     cholecalciferol, vitamin D3, 50 mcg (2,000 unit) Oral Tablet Take 1 Tablet (2,000 Units total) by mouth Once a day    cyanocobalamin (VITAMIN B 12) 1,000 mcg Oral Tablet Take 1 Tablet (1,000 mcg total) by mouth Once a day    DULoxetine (CYMBALTA DR) 60 mg Oral Capsule, Delayed Release(E.C.) Take 1 Capsule (60 mg total) by mouth Twice daily    empagliflozin (JARDIANCE) 25 mg Oral Tablet Take 1 Tablet (25 mg total) by mouth Once a day    gabapentin (NEURONTIN) 400 mg Oral Capsule Take 1 Capsule (400 mg total) by mouth Twice daily    gemfibrozil (LOPID) 600 mg Oral Tablet Take 1 Tablet (600 mg total) by mouth Twice a day before meals    glipiZIDE (GLUCOTROL) 5 mg Oral Tablet Take 1 Tablet (5 mg total) by mouth Every morning before breakfast    hydrochlorothiazide (MICROZIDE) 12.5 mg Oral Capsule Take 1 Capsule (12.5 mg total) by mouth Every evening    lisinopriL-hydrochlorothiazide (ZESTORETIC) 20-25 mg Oral Tablet Take 1 Tablet by mouth Once a day    MetFORMIN (GLUCOPHAGE) 1,000 mg Oral Tablet Take 1 Tablet (1,000 mg total) by mouth Twice daily    metoprolol succinate (TOPROL-XL) 100 mg Oral Tablet Sustained Release 24 hr Take 1 Tablet (100 mg total) by mouth Once a day    mirtazapine (REMERON)  15 mg Oral Tablet 1 Tablet (15 mg total) Every night    prazosin (MINIPRESS) 1 mg Oral Capsule 1 Capsule (1 mg total) Every evening    Venlafaxine (EFFEXOR) 100 mg Oral Tablet Take 1 Tablet (100 mg total) by mouth Three times a day     BP 139/89   Pulse 70   Temp 36.4 C (97.5 F)   Resp 16   Ht 1.651 m (5\' 5" )   Wt 79.7 kg (175 lb 11.2 oz)   SpO2 98%   BMI 29.24 kg/m       PHYSICAL EXAM:  General: Patient is vitally stable (see values). Appears calm and at rest. Dressed appropriately.    Skin: Warm and dry.     Eyes: Eyes are clear.    Pulmonary: Respiratory effort is unlabored.     Psychiatric: Patient is alert, appropriate mood, and in no acute distress.     Musculoskeletal: Patient ambulates without assistance    Cardiovascular: Palpable peripheral pulses.    Neurologic: Neurological exam is consistent with patient's age.      Gastrointestinal: The abdomen is soft, nontender and non-distended.   The abdomen is mildly obese.   Genitourinary: No costovertebral angle tenderness bilaterally.  No suprapubic fullness or tenderness.        ATTESTATION:   This was a shared visit with Dr. Cordelia Poche.  Limmie Patricia, FNP-C    I personally saw and evaluated the patient as part of a shared service with an APP. MDM (complete): NPV Level 4 (Moderate Complexity). On the day of the encounter, I independently of the APP spent a total of 45 minutes  in direct/indirect care of this patient including initial evaluation, review of laboratory, radiology, diagnostic studies, review of medical record, examination, order entry, coordination of care, documentation, and post-visit activities. The note above has been reviewed and/or edited by me to reflect my clinical assessment and medical decision making for this patient. Furthermore, the assessment and plan for this patient were created by me and were included at the top if this note for ease of reference.      We had a detailed discussion regarding the patient's diagnosis of favorable intermediate risk prostate cancer and management options. We discussed that management options include active surveillance, observation, radiation therapy, and radical prostatectomy. We discussed the advantages and disadvantages of each strategy. We discussed that active surveillance involves actively monitoring the course of the disease with the expectation to intervene with curative intent if the cancer progresses. We discussed that tissue-based molecular assays can be helpful in some men who are having difficulty deciding between active surveillance and active treatment. We discussed that there are different forms of radiation therapy that are available and I offered to arrange for the patient to meet with a  radiation oncologist to further discuss these options. We had a detailed discussion about the advantages and disadvantages of robotic-assisted laparoscopic radical prostatectomy. We reviewed the risks of surgery which include but are not limited to risks of general anesthesia (heart attack, stroke, pulmonary embolus, death), bleeding, infection, pain, positive surgical margins, rectal or bowel injury requiring a colostomy, vesicourethral anastomotic leak, need for prolonged Foley catheter drainage, nerve injury, lymphatic leak, scar tissue formation, need for additional surgeries, erectile dysfunction, and urinary incontinence. Per my assessment, the patient is a candidate for robotic-assisted laparoscopic radical prostatectomy; however, he understands that he may require adjuvant therapy if he is found to have adverse features on final pathology. Educational handouts and our office contact  information were provided to the patient. All questions have been addressed and after a detailed discussion with shared decision making the patient would like to proceed with surgery.    Trinda Pascal, MD  Assistant Professor, Surgeon   Division of Urologic Oncology  Department of Urology   [facility]

## 2023-05-07 NOTE — Addendum Note (Signed)
Addended byCordelia Poche, Shondell Poulson on: 05/07/2023 04:24 PM     Modules accepted: Level of Service

## 2023-05-10 ENCOUNTER — Encounter (HOSPITAL_COMMUNITY): Payer: Self-pay

## 2023-05-14 ENCOUNTER — Encounter (HOSPITAL_COMMUNITY): Payer: Self-pay

## 2023-05-15 ENCOUNTER — Inpatient Hospital Stay (HOSPITAL_COMMUNITY)
Admission: RE | Admit: 2023-05-15 | Discharge: 2023-05-15 | Disposition: A | Discharge status: Home / Self Care | Payer: 59 | Source: Ambulatory Visit

## 2023-05-15 ENCOUNTER — Encounter (HOSPITAL_COMMUNITY): Payer: Self-pay

## 2023-05-15 HISTORY — DX: Malignant (primary) neoplasm, unspecified: C80.1

## 2023-05-15 HISTORY — DX: Personal history of other diseases of the nervous system and sense organs: Z86.69

## 2023-05-15 HISTORY — DX: Hyperlipidemia, unspecified: E78.5

## 2023-05-15 HISTORY — DX: Sleep apnea, unspecified: G47.30

## 2023-05-15 HISTORY — DX: Dependence on other enabling machines and devices: Z99.89

## 2023-05-15 HISTORY — DX: Personal history of other diseases of urinary system: Z87.448

## 2023-05-15 HISTORY — DX: Cardiac murmur, unspecified: R01.1

## 2023-05-15 HISTORY — DX: Type 2 diabetes mellitus without complications: E11.9

## 2023-05-15 HISTORY — DX: Presence of spectacles and contact lenses: Z97.3

## 2023-06-04 ENCOUNTER — Encounter (INDEPENDENT_AMBULATORY_CARE_PROVIDER_SITE_OTHER): Payer: Self-pay | Admitting: UROLOGY

## 2023-06-06 ENCOUNTER — Inpatient Hospital Stay (HOSPITAL_COMMUNITY): Payer: 59 | Admitting: Student in an Organized Health Care Education/Training Program

## 2023-06-06 ENCOUNTER — Inpatient Hospital Stay
Admission: RE | Admit: 2023-06-06 | Discharge: 2023-06-07 | DRG: 708 | Disposition: A | Discharge status: Home / Self Care | Payer: 59 | Source: Ambulatory Visit | Attending: Urology | Admitting: Urology

## 2023-06-06 ENCOUNTER — Other Ambulatory Visit: Payer: Self-pay

## 2023-06-06 ENCOUNTER — Encounter (HOSPITAL_COMMUNITY): Payer: Self-pay | Admitting: Urology

## 2023-06-06 ENCOUNTER — Encounter (HOSPITAL_COMMUNITY)
Admission: RE | Disposition: A | Discharge status: Home / Self Care | Payer: Self-pay | Source: Ambulatory Visit | Attending: Urology

## 2023-06-06 DIAGNOSIS — G4730 Sleep apnea, unspecified: Secondary | ICD-10-CM | POA: Diagnosis present

## 2023-06-06 DIAGNOSIS — Z9079 Acquired absence of other genital organ(s): Secondary | ICD-10-CM

## 2023-06-06 DIAGNOSIS — I10 Essential (primary) hypertension: Secondary | ICD-10-CM | POA: Diagnosis present

## 2023-06-06 DIAGNOSIS — E785 Hyperlipidemia, unspecified: Secondary | ICD-10-CM | POA: Diagnosis present

## 2023-06-06 DIAGNOSIS — E119 Type 2 diabetes mellitus without complications: Secondary | ICD-10-CM | POA: Diagnosis present

## 2023-06-06 DIAGNOSIS — C61 Malignant neoplasm of prostate: Principal | ICD-10-CM | POA: Diagnosis present

## 2023-06-06 DIAGNOSIS — N529 Male erectile dysfunction, unspecified: Secondary | ICD-10-CM | POA: Diagnosis present

## 2023-06-06 DIAGNOSIS — Z79899 Other long term (current) drug therapy: Secondary | ICD-10-CM

## 2023-06-06 DIAGNOSIS — Z7984 Long term (current) use of oral hypoglycemic drugs: Secondary | ICD-10-CM

## 2023-06-06 DIAGNOSIS — F4310 Post-traumatic stress disorder, unspecified: Secondary | ICD-10-CM | POA: Diagnosis present

## 2023-06-06 DIAGNOSIS — H9193 Unspecified hearing loss, bilateral: Secondary | ICD-10-CM | POA: Diagnosis present

## 2023-06-06 HISTORY — PX: HX RADICAL PROSTATECTOMY: SHX70

## 2023-06-06 LAB — CBC WITH DIFF
BASOPHIL #: <0.1 10*3/uL (ref <=0.20)
BASOPHIL %: 0.4 %
EOSINOPHIL #: <0.1 10*3/uL (ref <=0.50)
EOSINOPHIL %: 0.4 %
HCT: 39.2 % (ref 38.9–52.0)
HGB: 13.3 g/dL — ABNORMAL LOW (ref 13.4–17.5)
IMMATURE GRANULOCYTE #: <0.1 10*3/uL (ref <0.10)
IMMATURE GRANULOCYTE %: 0.5 % (ref 0.0–1.0)
LYMPHOCYTE #: 0.95 10*3/uL — ABNORMAL LOW (ref 1.00–4.80)
LYMPHOCYTE %: 9.6 %
MCH: 27.6 pg (ref 26.0–32.0)
MCHC: 33.9 g/dL (ref 31.0–35.5)
MCV: 81.3 fL (ref 78.0–100.0)
MONOCYTE #: <0.1 10*3/uL — ABNORMAL LOW (ref 0.20–1.10)
MONOCYTE %: 0.9 %
MPV: 11.1 fL (ref 8.7–12.5)
NEUTROPHIL #: 8.75 10*3/uL — ABNORMAL HIGH (ref 1.50–7.70)
NEUTROPHIL %: 88.2 %
PLATELETS: 240 10*3/uL (ref 150–400)
RBC: 4.82 10*6/uL (ref 4.50–6.10)
RDW-CV: 12.9 % (ref 11.5–15.5)
WBC: 9.9 10*3/uL (ref 3.7–11.0)

## 2023-06-06 LAB — BASIC METABOLIC PANEL
ANION GAP: 9 mmol/L (ref 4–13)
BUN/CREA RATIO: 13 (ref 6–22)
BUN: 13 mg/dL (ref 8–25)
CALCIUM: 8.6 mg/dL (ref 8.6–10.2)
CHLORIDE: 106 mmol/L (ref 96–111)
CO2 TOTAL: 19 mmol/L — ABNORMAL LOW (ref 22–30)
CREATININE: 1.03 mg/dL (ref 0.75–1.35)
ESTIMATED GFR - MALE: 84 mL/min/BSA (ref >=60)
GLUCOSE: 215 mg/dL — ABNORMAL HIGH (ref 65–125)
POTASSIUM: 4.4 mmol/L (ref 3.5–5.1)
SODIUM: 134 mmol/L — ABNORMAL LOW (ref 136–145)

## 2023-06-06 LAB — BLOOD GAS W/ CO-OX, LYTES, LACTATE REFLEX
%FIO2 (ARTERIAL): 65 %
%FIO2 (ARTERIAL): 60 %
BASE DEFICIT: 4.1 mmol/L — ABNORMAL HIGH (ref 0.0–3.0)
BASE DEFICIT: 2.9 mmol/L (ref 0.0–3.0)
BICARBONATE (ARTERIAL): 21.7 mmol/L (ref 21.0–28.0)
BICARBONATE (ARTERIAL): 22.7 mmol/L (ref 21.0–28.0)
CARBOXYHEMOGLOBIN: 1.2 % (ref <=3.0)
CARBOXYHEMOGLOBIN: 1.1 % (ref <=3.0)
CHLORIDE: 105 mmol/L (ref 98–107)
CHLORIDE: 106 mmol/L (ref 98–107)
GLUCOSE: 168 mg/dL — ABNORMAL HIGH (ref 65–125)
GLUCOSE: 212 mg/dL — ABNORMAL HIGH (ref 65–125)
HEMATOCRITRT: 42 % (ref 37–50)
HEMATOCRITRT: 41 % (ref 37–50)
HEMOGLOBIN: 13.9 g/dL (ref 12.0–18.0)
HEMOGLOBIN: 13.8 g/dL (ref 12.0–18.0)
IONIZED CALCIUM: 1.26 mmol/L (ref 1.15–1.33)
IONIZED CALCIUM: 1.18 mmol/L (ref 1.15–1.33)
LACTATE: 1 mmol/L (ref <=1.9)
LACTATE: 1.2 mmol/L (ref <=1.9)
MET-HEMOGLOBIN: 0.7 % (ref <=1.5)
MET-HEMOGLOBIN: 1.1 % (ref <=1.5)
O2CT: 19.5 %
O2CT: 19.1 %
OXYHEMOGLOBIN: 97.9 % — ABNORMAL HIGH (ref 90.0–95.0)
OXYHEMOGLOBIN: 97.2 % — ABNORMAL HIGH (ref 90.0–95.0)
PAO2/FIO2 RATIO: 271
PAO2/FIO2 RATIO: 243
PCO2 (ARTERIAL): 49 mm/Hg — ABNORMAL HIGH (ref 35–45)
PCO2 (ARTERIAL): 38 mm/Hg (ref 35–45)
PH (ARTERIAL): 7.28 — ABNORMAL LOW (ref 7.35–7.45)
PH (ARTERIAL): 7.37 (ref 7.35–7.45)
PO2 (ARTERIAL): 176 mm/Hg — ABNORMAL HIGH (ref 83–108)
PO2 (ARTERIAL): 146 mm/Hg — ABNORMAL HIGH (ref 83–108)
SODIUM: 134 mmol/L — ABNORMAL LOW (ref 136–145)
SODIUM: 131 mmol/L — ABNORMAL LOW (ref 136–145)
WHOLE BLOOD POTASSIUM: 4.3 mmol/L (ref 3.5–5.1)
WHOLE BLOOD POTASSIUM: 4.6 mmol/L (ref 3.5–5.1)

## 2023-06-06 LAB — ABO & RH: ABO/RH(D): O POS

## 2023-06-06 LAB — POC BLOOD GLUCOSE (RESULTS)
GLUCOSE, POC: 134 mg/dl — ABNORMAL HIGH (ref 70–105)
GLUCOSE, POC: 205 mg/dl — ABNORMAL HIGH (ref 70–105)

## 2023-06-06 LAB — TYPE AND CROSS RED CELLS - UNITS
ABO/RH(D): O POS
ANTIBODY SCREEN: NEGATIVE
UNITS ORDERED: 2

## 2023-06-06 SURGERY — ROBOTIC SINGLE PORT PROSTATECTOMY
Anesthesia: General | Site: Abdomen | Wound class: Clean Contaminated Wounds-The respiratory, GI, Genital, or urinary

## 2023-06-06 MED ORDER — DEXMEDETOMIDINE 4 MCG/ML IV DILUTION
Freq: Once | INTRAMUSCULAR | Status: DC | PRN
Start: 2023-06-06 — End: 2023-06-06
  Administered 2023-06-06 (×2): 8 ug via INTRAVENOUS

## 2023-06-06 MED ORDER — GABAPENTIN 400 MG CAPSULE
400.0000 mg | ORAL_CAPSULE | Freq: Two times a day (BID) | ORAL | Status: DC
Start: 2023-06-06 — End: 2023-06-07
  Administered 2023-06-06 – 2023-06-07 (×2): 400 mg via ORAL
  Filled 2023-06-06 (×2): qty 1

## 2023-06-06 MED ORDER — VENLAFAXINE 75 MG TABLET
100.0000 mg | ORAL_TABLET | Freq: Three times a day (TID) | ORAL | Status: DC
Start: 2023-06-06 — End: 2023-06-07
  Administered 2023-06-06 – 2023-06-07 (×3): 100 mg via ORAL
  Filled 2023-06-06 (×5): qty 1

## 2023-06-06 MED ORDER — CEFAZOLIN 10 GRAM SOLUTION FOR INJECTION
2.0000 g | Freq: Once | INTRAMUSCULAR | Status: AC
Start: 2023-06-06 — End: 2023-06-06
  Administered 2023-06-06 (×2): 2 g via INTRAVENOUS
  Filled 2023-06-06: qty 10

## 2023-06-06 MED ORDER — HYDROMORPHONE (PF) 0.5 MG/0.5 ML INJECTION SYRINGE
INJECTION | Freq: Once | INTRAMUSCULAR | Status: DC | PRN
Start: 2023-06-06 — End: 2023-06-06
  Administered 2023-06-06 (×2): .5 mg via INTRAVENOUS

## 2023-06-06 MED ORDER — PROCHLORPERAZINE EDISYLATE 10 MG/2 ML (5 MG/ML) INJECTION SOLUTION
5.0000 mg | Freq: Three times a day (TID) | INTRAMUSCULAR | Status: DC | PRN
Start: 2023-06-06 — End: 2023-06-07

## 2023-06-06 MED ORDER — ALBUTEROL SULFATE HFA 90 MCG/ACTUATION AEROSOL INHALER
2.0000 | INHALATION_SPRAY | Freq: Four times a day (QID) | RESPIRATORY_TRACT | Status: DC | PRN
Start: 2023-06-06 — End: 2023-06-07
  Filled 2023-06-06: qty 8

## 2023-06-06 MED ORDER — HEPARIN (PORCINE) 5,000 UNIT/ML INJECTION SOLUTION
5000.0000 [IU] | Freq: Three times a day (TID) | INTRAMUSCULAR | Status: DC
Start: 2023-06-06 — End: 2023-06-07
  Administered 2023-06-06 – 2023-06-07 (×2): 5000 [IU] via SUBCUTANEOUS
  Filled 2023-06-06 (×3): qty 1

## 2023-06-06 MED ORDER — GLIPIZIDE 5 MG TABLET
5.0000 mg | ORAL_TABLET | Freq: Every morning | ORAL | Status: DC
Start: 2023-06-07 — End: 2023-06-07
  Administered 2023-06-07: 5 mg via ORAL
  Filled 2023-06-06 (×2): qty 1

## 2023-06-06 MED ORDER — ROCURONIUM 10 MG/ML INTRAVENOUS SYRINGE WRAPPER
INJECTION | Freq: Once | INTRAVENOUS | Status: DC | PRN
Start: 2023-06-06 — End: 2023-06-06
  Administered 2023-06-06: 20 mg via INTRAVENOUS
  Administered 2023-06-06: 40 mg via INTRAVENOUS
  Administered 2023-06-06: 20 mg via INTRAVENOUS
  Administered 2023-06-06: 30 mg via INTRAVENOUS
  Administered 2023-06-06: 60 mg via INTRAVENOUS

## 2023-06-06 MED ORDER — MIRTAZAPINE 15 MG TABLET
15.0000 mg | ORAL_TABLET | Freq: Every evening | ORAL | Status: DC
Start: 2023-06-06 — End: 2023-06-07
  Administered 2023-06-06: 15 mg via ORAL
  Filled 2023-06-06: qty 1

## 2023-06-06 MED ORDER — SUGAMMADEX 100 MG/ML INTRAVENOUS SOLUTION
INTRAVENOUS | Status: AC
Start: 2023-06-06 — End: 2023-06-06
  Filled 2023-06-06: qty 2

## 2023-06-06 MED ORDER — PRAZOSIN 1 MG CAPSULE
1.0000 mg | ORAL_CAPSULE | Freq: Every evening | ORAL | Status: DC
Start: 2023-06-06 — End: 2023-06-07
  Administered 2023-06-06: 0 mg via ORAL
  Filled 2023-06-06 (×2): qty 1

## 2023-06-06 MED ORDER — HYDROMORPHONE (PF) 0.5 MG/0.5 ML INJECTION SYRINGE
INJECTION | INTRAMUSCULAR | Status: AC
Start: 2023-06-06 — End: 2023-06-06
  Filled 2023-06-06: qty 0.5

## 2023-06-06 MED ORDER — DEXAMETHASONE SODIUM PHOSPHATE 4 MG/ML INJECTION SOLUTION
Freq: Once | INTRAMUSCULAR | Status: DC | PRN
Start: 2023-06-06 — End: 2023-06-06
  Administered 2023-06-06: 4 mg via INTRAVENOUS

## 2023-06-06 MED ORDER — SODIUM CHLORIDE 0.9 % (FLUSH) INJECTION SYRINGE
2.0000 mL | INJECTION | Freq: Three times a day (TID) | INTRAMUSCULAR | Status: DC
Start: 2023-06-06 — End: 2023-06-07
  Administered 2023-06-06: 5 mL
  Administered 2023-06-07: 6 mL

## 2023-06-06 MED ORDER — PROPOFOL 10 MG/ML INTRAVENOUS EMULSION
INTRAVENOUS | Status: AC
Start: 2023-06-06 — End: 2023-06-06
  Filled 2023-06-06: qty 20

## 2023-06-06 MED ORDER — FENTANYL (PF) 50 MCG/ML INJECTION SOLUTION
INTRAMUSCULAR | Status: AC
Start: 2023-06-06 — End: 2023-06-06
  Filled 2023-06-06: qty 2

## 2023-06-06 MED ORDER — SENNOSIDES 8.6 MG-DOCUSATE SODIUM 50 MG TABLET
1.0000 | ORAL_TABLET | Freq: Two times a day (BID) | ORAL | Status: DC
Start: 2023-06-06 — End: 2023-06-07
  Administered 2023-06-06 – 2023-06-07 (×2): 1 via ORAL
  Filled 2023-06-06 (×2): qty 1

## 2023-06-06 MED ORDER — DROPERIDOL 2.5 MG/ML INJECTION SOLUTION
0.6250 mg | Freq: Once | INTRAMUSCULAR | Status: DC | PRN
Start: 2023-06-06 — End: 2023-06-07

## 2023-06-06 MED ORDER — ONDANSETRON HCL (PF) 4 MG/2 ML INJECTION SOLUTION
INTRAMUSCULAR | Status: AC
Start: 2023-06-06 — End: 2023-06-06
  Filled 2023-06-06: qty 2

## 2023-06-06 MED ORDER — LISINOPRIL 20 MG TABLET
20.0000 mg | ORAL_TABLET | Freq: Every day | ORAL | Status: DC
Start: 2023-06-07 — End: 2023-06-07
  Administered 2023-06-07: 20 mg via ORAL
  Filled 2023-06-06: qty 1

## 2023-06-06 MED ORDER — SODIUM CHLORIDE 0.9 % (FLUSH) INJECTION SYRINGE
2.0000 mL | INJECTION | INTRAMUSCULAR | Status: DC | PRN
Start: 2023-06-06 — End: 2023-06-07

## 2023-06-06 MED ORDER — SODIUM CHLORIDE 0.9% FLUSH BAG - 250 ML
INTRAVENOUS | Status: DC | PRN
Start: 2023-06-06 — End: 2023-06-07

## 2023-06-06 MED ORDER — SODIUM CHLORIDE 0.9 % (FLUSH) INJECTION SYRINGE
2.0000 mL | INJECTION | Freq: Three times a day (TID) | INTRAMUSCULAR | Status: DC
Start: 2023-06-06 — End: 2023-06-07
  Administered 2023-06-06 – 2023-06-07 (×2): 5 mL

## 2023-06-06 MED ORDER — HYDROCHLOROTHIAZIDE 12.5 MG CAPSULE
12.5000 mg | ORAL_CAPSULE | Freq: Every evening | ORAL | Status: DC
Start: 2023-06-06 — End: 2023-06-06

## 2023-06-06 MED ORDER — SODIUM CHLORIDE 0.9 % (FLUSH) INJECTION SYRINGE
2.0000 mL | INJECTION | Freq: Three times a day (TID) | INTRAMUSCULAR | Status: DC
Start: 2023-06-06 — End: 2023-06-07
  Administered 2023-06-06: 5 mL
  Administered 2023-06-07: 4 mL

## 2023-06-06 MED ORDER — METFORMIN 500 MG TABLET
1000.0000 mg | ORAL_TABLET | Freq: Two times a day (BID) | ORAL | Status: DC
Start: 2023-06-06 — End: 2023-06-07
  Administered 2023-06-06 – 2023-06-07 (×2): 1000 mg via ORAL
  Filled 2023-06-06 (×2): qty 2

## 2023-06-06 MED ORDER — HYDROMORPHONE (PF) 0.5 MG/0.5 ML INJECTION SYRINGE
0.4000 mg | INJECTION | INTRAMUSCULAR | Status: DC | PRN
Start: 2023-06-06 — End: 2023-06-07

## 2023-06-06 MED ORDER — DEXTROSE 5% IN WATER (D5W) FLUSH BAG - 250 ML
INTRAVENOUS | Status: DC | PRN
Start: 2023-06-06 — End: 2023-06-07

## 2023-06-06 MED ORDER — SODIUM CHLORIDE 0.9 % IRRIGATION SOLUTION
1000.0000 mL | Status: DC | PRN
Start: 2023-06-06 — End: 2023-06-06
  Administered 2023-06-06: 1000 mL

## 2023-06-06 MED ORDER — LIDOCAINE (PF) 20 MG/ML (2 %) INJECTION SOLUTION
INTRAMUSCULAR | Status: AC
Start: 2023-06-06 — End: 2023-06-06
  Filled 2023-06-06: qty 5

## 2023-06-06 MED ORDER — DEXAMETHASONE SODIUM PHOSPHATE 4 MG/ML INJECTION SOLUTION
INTRAMUSCULAR | Status: AC
Start: 2023-06-06 — End: 2023-06-06
  Filled 2023-06-06: qty 1

## 2023-06-06 MED ORDER — GELATIN MATRIX SEALANT (FLOSEAL) 10 ML KIT
PACK | CUTANEOUS | Status: AC
Start: 2023-06-06 — End: 2023-06-06
  Filled 2023-06-06: qty 2

## 2023-06-06 MED ORDER — HYDROCODONE 5 MG-ACETAMINOPHEN 325 MG TABLET
2.0000 | ORAL_TABLET | Freq: Four times a day (QID) | ORAL | Status: DC | PRN
Start: 2023-06-06 — End: 2023-06-07

## 2023-06-06 MED ORDER — ACETAMINOPHEN 1,000 MG/100 ML (10 MG/ML) INTRAVENOUS SOLUTION
Freq: Once | INTRAVENOUS | Status: DC | PRN
Start: 2023-06-06 — End: 2023-06-06
  Administered 2023-06-06: 1000 mg via INTRAVENOUS

## 2023-06-06 MED ORDER — PROCHLORPERAZINE EDISYLATE 10 MG/2 ML (5 MG/ML) INJECTION SOLUTION
5.0000 mg | Freq: Once | INTRAMUSCULAR | Status: DC | PRN
Start: 2023-06-06 — End: 2023-06-07

## 2023-06-06 MED ORDER — GELATIN MATRIX SEALANT (FLOSEAL) 10 ML KIT
PACK | Freq: Once | CUTANEOUS | Status: DC | PRN
Start: 2023-06-06 — End: 2023-06-06
  Administered 2023-06-06 (×2): 10 mL via TOPICAL

## 2023-06-06 MED ORDER — FAMOTIDINE 20 MG TABLET
20.0000 mg | ORAL_TABLET | Freq: Two times a day (BID) | ORAL | Status: DC
Start: 2023-06-06 — End: 2023-06-07
  Administered 2023-06-06 – 2023-06-07 (×2): 20 mg via ORAL
  Filled 2023-06-06 (×2): qty 1

## 2023-06-06 MED ORDER — KETOROLAC 30 MG/ML (1 ML) INJECTION SOLUTION
INTRAMUSCULAR | Status: AC
Start: 2023-06-06 — End: 2023-06-06
  Filled 2023-06-06: qty 1

## 2023-06-06 MED ORDER — HYDROCHLOROTHIAZIDE 25 MG TABLET
25.0000 mg | ORAL_TABLET | Freq: Every day | ORAL | Status: DC
Start: 2023-06-07 — End: 2023-06-07
  Administered 2023-06-07: 25 mg via ORAL
  Filled 2023-06-06: qty 1

## 2023-06-06 MED ORDER — LACTATED RINGERS INTRAVENOUS SOLUTION
INTRAVENOUS | Status: DC
Start: 2023-06-06 — End: 2023-06-07
  Administered 2023-06-07: 0 mL via INTRAVENOUS

## 2023-06-06 MED ORDER — BUSPIRONE 5 MG TABLET
10.0000 mg | ORAL_TABLET | Freq: Three times a day (TID) | ORAL | Status: DC
Start: 2023-06-06 — End: 2023-06-07
  Administered 2023-06-06 – 2023-06-07 (×3): 10 mg via ORAL
  Filled 2023-06-06 (×3): qty 2

## 2023-06-06 MED ORDER — KETAMINE 10 MG/ML INJECTION WRAPPER
INTRAMUSCULAR | Status: AC
Start: 2023-06-06 — End: 2023-06-06
  Filled 2023-06-06: qty 5

## 2023-06-06 MED ORDER — SODIUM CHLORIDE 0.9% FLUSH BAG - 250 ML
INTRAVENOUS | Status: AC | PRN
Start: 2023-06-06 — End: ?

## 2023-06-06 MED ORDER — BUPIVACAINE LIPOSOME(PF) 1.3 %(13.3 MG/ML) SUSPENSION FOR INFILTRATION
40.0000 mL | Freq: Once | Status: DC | PRN
Start: 2023-06-06 — End: 2023-06-06
  Administered 2023-06-06: 40 mL via INTRAMUSCULAR
  Filled 2023-06-06: qty 20

## 2023-06-06 MED ORDER — FENTANYL (PF) 50 MCG/ML INJECTION SOLUTION
Freq: Once | INTRAMUSCULAR | Status: DC | PRN
Start: 2023-06-06 — End: 2023-06-06
  Administered 2023-06-06: 25 ug via INTRAVENOUS
  Administered 2023-06-06: 50 ug via INTRAVENOUS
  Administered 2023-06-06 (×2): 25 ug via INTRAVENOUS
  Administered 2023-06-06: 75 ug via INTRAVENOUS

## 2023-06-06 MED ORDER — HYDROCODONE 5 MG-ACETAMINOPHEN 325 MG TABLET
1.0000 | ORAL_TABLET | Freq: Four times a day (QID) | ORAL | Status: DC | PRN
Start: 2023-06-06 — End: 2023-06-07

## 2023-06-06 MED ORDER — EPHEDRINE SULFATE 5 MG/ML INTRAVENOUS SOLUTION
INTRAVENOUS | Status: AC
Start: 2023-06-06 — End: 2023-06-06
  Filled 2023-06-06: qty 10

## 2023-06-06 MED ORDER — LIDOCAINE (PF) 100 MG/5 ML (2 %) INTRAVENOUS SYRINGE
INJECTION | Freq: Once | INTRAVENOUS | Status: DC | PRN
Start: 2023-06-06 — End: 2023-06-06
  Administered 2023-06-06: 78 mg via INTRAVENOUS

## 2023-06-06 MED ORDER — ACETAMINOPHEN 325 MG TABLET
650.0000 mg | ORAL_TABLET | Freq: Four times a day (QID) | ORAL | Status: DC | PRN
Start: 2023-06-06 — End: 2023-06-07
  Administered 2023-06-07: 0 mg via ORAL
  Filled 2023-06-06: qty 2

## 2023-06-06 MED ORDER — DAPAGLIFLOZIN PROPANEDIOL 5 MG TABLET
10.0000 mg | ORAL_TABLET | Freq: Every day | ORAL | Status: DC
Start: 2023-06-06 — End: 2023-06-07
  Administered 2023-06-07: 10 mg via ORAL
  Filled 2023-06-06 (×3): qty 2

## 2023-06-06 MED ORDER — ROSUVASTATIN 20 MG TABLET
20.0000 mg | ORAL_TABLET | Freq: Every day | ORAL | Status: DC
Start: 2023-06-06 — End: 2023-06-07
  Administered 2023-06-06: 20 mg via ORAL
  Filled 2023-06-06: qty 1

## 2023-06-06 MED ORDER — GLYCOPYRROLATE 0.2 MG/ML INJECTION SOLUTION
INTRAMUSCULAR | Status: AC
Start: 2023-06-06 — End: 2023-06-06
  Filled 2023-06-06: qty 1

## 2023-06-06 MED ORDER — LISINOPRIL 20 MG-HYDROCHLOROTHIAZIDE 25 MG TABLET
1.0000 | ORAL_TABLET | Freq: Every day | ORAL | Status: DC
Start: 2023-06-06 — End: 2023-06-06

## 2023-06-06 MED ORDER — KETAMINE 10 MG/ML INJECTION WRAPPER
Freq: Once | INTRAMUSCULAR | Status: DC | PRN
Start: 2023-06-06 — End: 2023-06-06
  Administered 2023-06-06: 30 mg via INTRAVENOUS
  Administered 2023-06-06 (×2): 10 mg via INTRAVENOUS

## 2023-06-06 MED ORDER — DEXTROSE 5% IN WATER (D5W) FLUSH BAG - 250 ML
INTRAVENOUS | Status: AC | PRN
Start: 2023-06-06 — End: ?

## 2023-06-06 MED ORDER — GEMFIBROZIL 600 MG TABLET
600.0000 mg | ORAL_TABLET | Freq: Two times a day (BID) | ORAL | Status: DC
Start: 2023-06-07 — End: 2023-06-07
  Administered 2023-06-07: 600 mg via ORAL
  Filled 2023-06-06 (×3): qty 1

## 2023-06-06 MED ORDER — METOPROLOL SUCCINATE ER 50 MG TABLET,EXTENDED RELEASE 24 HR
100.0000 mg | ORAL_TABLET | Freq: Every day | ORAL | Status: DC
Start: 2023-06-06 — End: 2023-06-07
  Administered 2023-06-07: 100 mg via ORAL
  Filled 2023-06-06: qty 2

## 2023-06-06 MED ORDER — BUPIVACAINE LIPOSOME(PF) 1.3 %(13.3 MG/ML) SUSPENSION FOR INFILTRATION
Status: AC
Start: 2023-06-06 — End: 2023-06-06
  Filled 2023-06-06: qty 20

## 2023-06-06 MED ORDER — ONDANSETRON HCL (PF) 4 MG/2 ML INJECTION SOLUTION
Freq: Once | INTRAMUSCULAR | Status: DC | PRN
Start: 2023-06-06 — End: 2023-06-06
  Administered 2023-06-06: 4 mg via INTRAVENOUS

## 2023-06-06 MED ORDER — BUPIVACAINE (PF) 0.25 % (2.5 MG/ML) INJECTION SOLUTION
INTRAMUSCULAR | Status: AC
Start: 2023-06-06 — End: 2023-06-06
  Filled 2023-06-06: qty 30

## 2023-06-06 MED ORDER — HYDROMORPHONE (PF) 0.5 MG/0.5 ML INJECTION SYRINGE
0.2000 mg | INJECTION | INTRAMUSCULAR | Status: DC | PRN
Start: 2023-06-06 — End: 2023-06-07

## 2023-06-06 MED ORDER — MIDAZOLAM (PF) 1 MG/ML INJECTION SOLUTION
Freq: Once | INTRAMUSCULAR | Status: DC | PRN
Start: 2023-06-06 — End: 2023-06-06
  Administered 2023-06-06 (×2): 2 mg via INTRAVENOUS

## 2023-06-06 MED ORDER — SODIUM CHLORIDE 0.9 % INTRAVENOUS SOLUTION
INTRAVENOUS | Status: DC | PRN
Start: 2023-06-06 — End: 2023-06-06
  Administered 2023-06-06: 0 via INTRAVENOUS

## 2023-06-06 MED ORDER — ONDANSETRON HCL (PF) 4 MG/2 ML INJECTION SOLUTION
4.0000 mg | Freq: Four times a day (QID) | INTRAMUSCULAR | Status: DC | PRN
Start: 2023-06-06 — End: 2023-06-07

## 2023-06-06 MED ORDER — SUGAMMADEX 100 MG/ML INTRAVENOUS SOLUTION
Freq: Once | INTRAVENOUS | Status: DC | PRN
Start: 2023-06-06 — End: 2023-06-06
  Administered 2023-06-06: 200 mg via INTRAVENOUS

## 2023-06-06 MED ORDER — PROPOFOL 10 MG/ML IV BOLUS
INJECTION | Freq: Once | INTRAVENOUS | Status: DC | PRN
Start: 2023-06-06 — End: 2023-06-06
  Administered 2023-06-06: 20 mg via INTRAVENOUS
  Administered 2023-06-06: 120 mg via INTRAVENOUS
  Administered 2023-06-06: 20 mg via INTRAVENOUS
  Administered 2023-06-06: 10 mg via INTRAVENOUS

## 2023-06-06 MED ORDER — HEPARIN (PORCINE) 5,000 UNIT/ML INJECTION SOLUTION
5000.0000 [IU] | Freq: Once | INTRAMUSCULAR | Status: AC
Start: 2023-06-06 — End: 2023-06-06
  Administered 2023-06-06: 5000 [IU] via SUBCUTANEOUS
  Filled 2023-06-06: qty 1

## 2023-06-06 MED ORDER — ATROPINE 0.4 MG/ML INJECTION WRAPPER
INTRAMUSCULAR | Status: AC
Start: 2023-06-06 — End: 2023-06-06
  Filled 2023-06-06: qty 1

## 2023-06-06 MED ORDER — MIDAZOLAM 1 MG/ML INJECTION SOLUTION
INTRAMUSCULAR | Status: AC
Start: 2023-06-06 — End: 2023-06-06
  Filled 2023-06-06: qty 2

## 2023-06-06 MED ORDER — WATER FOR INJECTION, STERILE INTRAVENOUS SOLUTION
Freq: Once | INTRAVENOUS | Status: DC | PRN
Start: 2023-06-06 — End: 2023-06-06

## 2023-06-06 MED ORDER — WATER FOR IRRIGATION, STERILE SOLUTION
1000.0000 mL | Status: DC | PRN
Start: 2023-06-06 — End: 2023-06-06
  Administered 2023-06-06: 1000 mL

## 2023-06-06 MED ORDER — DULOXETINE 60 MG CAPSULE,DELAYED RELEASE
60.0000 mg | DELAYED_RELEASE_CAPSULE | Freq: Two times a day (BID) | ORAL | Status: DC
Start: 2023-06-06 — End: 2023-06-07
  Administered 2023-06-06 – 2023-06-07 (×2): 60 mg via ORAL
  Filled 2023-06-06 (×2): qty 1

## 2023-06-06 MED ORDER — ONDANSETRON HCL (PF) 4 MG/2 ML INJECTION SOLUTION
4.0000 mg | Freq: Once | INTRAMUSCULAR | Status: DC | PRN
Start: 2023-06-06 — End: 2023-06-07

## 2023-06-06 SURGICAL SUPPLY — 58 items
ADH SKNCLS CYNCRLT EXOFIN HVSC STRL TISS LF  DISP 1G (MED SURG SUPPLIES) ×4 IMPLANT
APPL 70% ISPRP 2% CHG 26ML CHLRPRP HI-LT ORNG PREP STRL LF  DISP CLR (MED SURG SUPPLIES) ×2 IMPLANT
BASIN MED GRAD 32OZ BLU STRL (MED SURG SUPPLIES) ×2
CATH URETH BARDEX LUBRICATH 18FR STD FOLEY 2W 2 OPPOSE DRAIN EYE SHORT OPN TIP RUB DDRGL HDRPH 5CC (UROLOGICAL SUPPLIES) ×2 IMPLANT
CATH URETH BARDEX LUBRICATH 20FR STD FOLEY 2W 2 OPPOSE DRAIN EYE SHORT OPN TIP RUB DDRGL HDRPH 5CC (UROLOGICAL SUPPLIES) ×2 IMPLANT
CATH URETH DOVER 18FR 16IN FOLEY 2W REINF TIP SIL NPOR 5CC STRL LF (UROLOGICAL SUPPLIES) ×2 IMPLANT
CATH URETH DOVER 20FR 16IN FOLEY 2W REINF TIP BAL SIL NPOR 5ML STRL LF (UROLOGICAL SUPPLIES) ×2 IMPLANT
CATH URETH DOVER 20FR FOLEY 2W LRG SMOOTH DRAIN EYE FIRM TIP SIL 10ML STRL LF  BLU STRP CLR (UROLOGICAL SUPPLIES) ×2 IMPLANT
CATH URETH RBNL 18FR 16IN UNIV FNL INTMT SMOOTH TIP COMFORT EYE RADOPQ VNYL LF  CLR (UROLOGICAL SUPPLIES) ×4 IMPLANT
CLIP LRG INTERNAL WECK HEMOLOK PLMR LGT NONAB CART STRL DISP LF  PURP (WOUND CARE SUPPLY) ×4 IMPLANT
CLIP SUT LPR TY VICRYL PDS ABS STRL LF (WOUND CARE SUPPLY) ×2 IMPLANT
CONV USE 338641 - PACK SURG LAPSCP NONST DISP LF (CUSTOM TRAYS & PACK) ×2 IMPLANT
COVER CAM SHEATH DISP STRL LF (DRAPE/PACKS/SHEETS/OR TOWEL) ×4 IMPLANT
DEVICE ENDOS ENDO PNUT 5MM 45CM SWAB COTTON DISP (ENDOSCOPIC SUPPLIES) ×2
DISCONTINUED USE 338708 - BASIN MED GRAD 32OZ BLU STRL (MED SURG SUPPLIES) ×2
DISCONTINUED USE 340506 - BASIN MED GRAD 32OZ BLU STRL (MED SURG SUPPLIES) ×2 IMPLANT
DISCONTINUED USE ITEM 328361 - JELLY LUB EZ BCTRST H2O SOL NG_RS FLPTP TUBE STRL 2OZ LF (MED SURG SUPPLIES) ×4 IMPLANT
DRAIN INCS 15FR JP SIL HBLS CHNL STRL LF  30MM RND DISP WHT (MED SURG SUPPLIES) ×2 IMPLANT
DRAPE 2 INCS FILM ANTIMIC 33X23IN IOBN STRL SURG (DRAPE/PACKS/SHEETS/OR TOWEL) ×2 IMPLANT
DRAPE 4FT BIG CA BCK TBL MODL 427 HVDTY OR SPEC LF  STRL DISP SURG (DRAPE/PACKS/SHEETS/OR TOWEL) ×2 IMPLANT
DRAPE 66X44IN POLYUR WRMR ORS ORS FLUID WARM SYS STRL DISP (DRAPE/PACKS/SHEETS/OR TOWEL) ×2 IMPLANT
DRAPE ARM LF  STRL DISP INSTR (DRAPE/PACKS/SHEETS/OR TOWEL) ×4 IMPLANT
GARMENT COMPRESS MED CALF CENTAURA NYL VASOGRAD LTWT BRTHBL SEQ FIL BLU 18- IN (MED SURG SUPPLIES) ×2 IMPLANT
HEMOSTAT ABS 8X4IN FLXB SHR WV_SRGCL STRL DISP (WOUND CARE SUPPLY) ×4 IMPLANT
IRR SUCT STRKFL2 TIP STRL LF  DISP (ENDOSCOPIC SUPPLIES) ×2 IMPLANT
JELLY LUB EZ BCTRST H2O SOL NG_RS FLPTP TUBE STRL 2OZ LF (MED SURG SUPPLIES) ×4
KIT LAPSCP ENTRYGUIDE 100MM 25MM STRL LF  DISP STD (ENDOSCOPIC SUPPLIES) ×2 IMPLANT
LUB INSTR BTL PAD ELC LUBE FOAM STRL (MISCELLANEOUS PT CARE ITEMS) ×2 IMPLANT
MBO USE ITEM 130230 - DEVICE ENDOS ENDO PNUT 5MM 45CM SWAB COTTON DISP (ENDOSCOPIC SUPPLIES) ×2 IMPLANT
METER URINE EZ-LOK DRAIN BAG 350ML 2.5L STRL LF  DISP (UROLOGICAL SUPPLIES) ×2 IMPLANT
NEEDLE SPINAL 6IN 22GA QUINCKE XLNG METAL LL STY HUB STRL LF  DISP (MED SURG SUPPLIES) IMPLANT
PACK SURG LAPSCP NONST DISP LF (CUSTOM TRAYS & PACK) ×2
PACK SURG UNIV SIRUS SPLT II REINF TBL CVR 2 ADH SD DRP STRL 90X50IN 79X35IN LF (CUSTOM TRAYS & PACK) ×2 IMPLANT
PORT ACCESS DAVINCI KIT ACCESS PORT SM INCS SHORT ENTRY GUIDE WOUND PRTC 2.7 CM TO 7 CM INCS (ENDOSCOPIC SUPPLIES) ×2 IMPLANT
PORT HAND ACCESS 100MM BLDLS OPTC TIP PLM GRIP OBTURATOR AIRSEAL STRL DISP 12MM LF (MED SURG SUPPLIES) ×2 IMPLANT
PORT HAND ACCESS 120MM 2 WL CANN BLDLS OPTC TIP PLM GRIP OBTURATOR VALVELESS TROCAR AIRSEAL 12MM (ENDOSCOPIC SUPPLIES) ×2 IMPLANT
POUCH SPEC RETR 34.5CM 10MM E-CTCH GLD POLYUR ERG HNDL LONG CYL TUBE 29.5CM LAPSCP LF  DISP (ENDOSCOPIC SUPPLIES) ×6 IMPLANT
PROTECTOR BACK SURGYFOAM NONSLIP TBL OP NONSTRL DISP (MED SURG SUPPLIES) ×2 IMPLANT
RELOAD STPLR 2MM 2.5MM 3MM 45MM EGIA TI MED VAS TISS ARTC KNIFE BLADE LGR ANVIL BCKT STRL LF  DISP (SUTURE AIDS) ×2 IMPLANT
RESERVOIR DRAIN SIL JP BULB 100CC STRL LF  DISP (MED SURG SUPPLIES) ×2 IMPLANT
SET TUBING AIRSEAL ACT CHRCL 3 LUM FILTER STRL LF  DISP (MED SURG SUPPLIES) ×2 IMPLANT
SHEATH SURG INSTR STRL LF  DISP (ROBOTIC SUPPLIES) ×12 IMPLANT
SPONGE LAP 18X4IN STD RADOPQ 4 PLY REINF ABS RFDETECT COTTON STRL LF  DISP (MED SURG SUPPLIES) ×2 IMPLANT
STAPLER INTERNAL 26CMX4MM TI XL UNIV X THKTIS ARTC LRG BCKT STRL LF  DISP EGIA ULTRA ENDOS 12MM (SUTURE AIDS) ×2 IMPLANT
STAPLER SKIN 4.1X6.5MM 35 W STPL CART LF  APS U DISP CLR SS PLASTIC (WOUND CARE SUPPLY) ×2 IMPLANT
SUTURE 3-0 CV-23 VLOC 180 6IN GRN ABS ABS (SUTURE/WOUND CLOSURE) ×6 IMPLANT
SUTURE SILK 2-0 FS PERMAHAND 18IN BLK BRD NONAB (SUTURE/WOUND CLOSURE) ×6 IMPLANT
SYRINGE 50ML LF  STRL GRAD N-PYRG DEHP-FR PVC FREE CATH TIP DISP CLR (MED SURG SUPPLIES) ×6 IMPLANT
TAPE ADH 3IN 1538-3 BX/4 ROLLS (WOUND CARE SUPPLY) ×6 IMPLANT
TAPE SURG CLOTH 4IN X 10YD_2864 12/CS (WOUND CARE SUPPLY) ×2 IMPLANT
TIP CAUT 5MM SPAT NONST LF  DISP (ROBOTIC SUPPLIES) ×2 IMPLANT
TIP SCISSOR STRL LF  DISP MONOPOL CURVE (ROBOTIC SUPPLIES) ×2 IMPLANT
TOWEL 26X16IN COTTON BLU SAF DISP SURG STRL LF (DRAPE/PACKS/SHEETS/OR TOWEL) ×8 IMPLANT
TRAY CATH 16FR 1 LYR URMTR DRAIN BAG 2W SIL PVP ADULT 2.5L LTX (UROLOGICAL SUPPLIES) ×2 IMPLANT
TRAY SKIN SCRUB 8IN VNYL COTTON 6 WNG 6 SPONGE STICK 2 TIP APPL DRY STRL LF (MED SURG SUPPLIES) ×2 IMPLANT
TROCAR LAPSCP STD 100MM 12MM VERSAONE FIX CANN BLDLS DLPHN NOSE TIP STRL LF  DISP (ENDOSCOPIC SUPPLIES) ×2 IMPLANT
TROCAR LAPSCP STD 100MM 5MM VERSAONE FIX CANN BLDLS DLPHN NOSE TIP OPTC STRL LF  DISP (ENDOSCOPIC SUPPLIES) ×2 IMPLANT
TUBING SUCT CLR 6FT 3/16IN MEDIVAC MXGR MALE TO MALE CONN NCDTV STRL LF (MED SURG SUPPLIES) ×4 IMPLANT

## 2023-06-06 NOTE — OR PostOp (Signed)
Pt placed in PACU bed #31, while awaiting floor bed to be ready.  Mom at bedside with pt.  Pt and family aware of delays.

## 2023-06-06 NOTE — OR Nursing (Signed)
Called patient's mother Steward Drone with an update as per Dr. Cordelia Poche.

## 2023-06-06 NOTE — H&P (Signed)
[facility]  Department of Urologic Surgery   Pre-operative History and Physical    Christian Williamson  [id number]  10/17/1966    CC:   1. Favorable intermediate risk prostate cancer   02/2023 - MRI Prostate [facility]):  32 cc, PI-RADS 5 lesion   03/2023 - MRI fusion TR [facility]): 32 cc, ASAP 5/14 cores, GG1 2/14 cores, GG2 1/14 cores      2. Past medical history includes HTN, DM II, erectile dysfunction   3. Past surgical history includes bilateral knee surgery and colonoscopy (no abdominal surgeries)    HPI: Christian Williamson is a 57 y.o. male with a history of above who presents for robotic radical prostatectomy (single port). No new issues.    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 No issue as adult.    History of kidney disease     stones    HTN (hypertension)     controlled with medication    Hyperlipidemia     recently started on crestor    PTSD (post-traumatic stress disorder)     Sleep apnea     Type 2 diabetes mellitus (CMS HCC)     Wears glasses           PSHx:   Past Surgical History:   Procedure Laterality Date    KNEE SURGERY Bilateral     PROSTATE BIOPSY      TESTICLE SURGERY           Family Hx: Marland Kitchen  Family Medical History:       Problem Relation (Age of Onset)    Blindness Father    Cancer Other    Diabetes Father, Other    Glaucoma Mother            Social Hx:   Social History     Socioeconomic History    Marital status: Divorced     Spouse name: Not on file    Number of children: Not on file    Years of education: Not on file    Highest education level: Not on file   Occupational History    Not on file   Tobacco Use    Smoking status: Never    Smokeless tobacco: Never    Tobacco comments:     Tried cigarettes  in past   Vaping Use    Vaping status: Never Used   Substance and Sexual Activity    Alcohol use: Not Currently     Comment: no use in past 20 years    Drug use: Never    Sexual activity: Not on file   Other Topics Concern    Abuse/Domestic Violence Not Asked    Caffeine Concern Not Asked    Calcium intake adequate Not Asked    Computer Use Not Asked    Drives Not Asked    Exercise Concern Not Asked    Helmet Use Not Asked    Seat Belt Not Asked    Special Diet Not Asked    Sunscreen used Not Asked  Uses Cane Not Asked    Uses walker Not Asked    Uses wheelchair Not Asked    Right hand dominant Not Asked    Left hand dominant Not Asked    Ambidextrous Not Asked    Shift Work Not Asked    Unusual Sleep-Wake Schedule Not Asked    Ability to Walk 1 Flight of Steps without SOB/CP Yes    Routine Exercise No    Ability to Walk 2 Flight of Steps without SOB/CP Yes    Unable to Ambulate Not Asked    Total Care Not Asked    Ability To Do Own ADL's Yes    Uses Walker No    Other Activity Level Not Asked    Uses Cane No   Social History Narrative    Not on file     Social Determinants of Health     Financial Resource Strain: Not on file   Transportation Needs: Not on file   Social Connections: Not on file   Intimate Partner Violence: Not on file   Housing Stability: Not on file     Medications:   No current outpatient medications on file.     Allergies:   Allergies   Allergen Reactions    Poison Oak Extract Hives/ Urticaria     "Poison Oak"       ROS:  All 10 systems were reviewed.  There were pertinent positives in: Genital urinary system  There are no new changes to the prior reviewed review of systems.    Physical Exam:  There were no vitals taken for this visit.      General - Alert/oriented; NAD  Neck - Supple, trachea midline  Lungs - Respirations are unlabored, good inspiratory effort  Abdomen - Non-distended   Musculoskeletal - 4 extremities intact  Neurological - CN II-XII Grossly intact  GU system - Deferred. Plan  to examine in OR    Assessment:  57 y.o. male with:     1. Favorable intermediate risk prostate cancer   02/2023 - MRI Prostate [facility]):  32 cc, PI-RADS 5 lesion   03/2023 - MRI fusion TR [facility]): 32 cc, ASAP 5/14 cores, GG1 2/14 cores, GG2 1/14 cores      2. Past medical history includes HTN, DM II, erectile dysfunction   3. Past surgical history includes bilateral knee surgery and colonoscopy (no abdominal surgeries)    Plan:  To OR for robotic prostatectomy (single port)  Consent obtained  The patient understands the risks of the procedure, which include bleeding, infection, recurrence, damage to the surrounding structures, failure to correct pathology, loss of sensation, and the risks of anesthesia (heart attack, stroke, death, etc.)    All questions of patient and family answered    Rich Fuchs, MD    I saw and examined the patient.  I reviewed the resident's note.   I agree with the findings and plan of care as documented in the resident's note.   Any exceptions/additions are edited/noted.    Trinda Pascal, MD  Assistant Professor, Surgeon   Division of Urologic Oncology  Department of Urology   [facility]

## 2023-06-06 NOTE — Care Plan (Signed)
Pt will remain free of falls. Plan of care ongoing.

## 2023-06-06 NOTE — Anesthesia Procedure Notes (Signed)
Christian Williamson    Arterial Line Procedure    Pt location: In OR  Consent:     Consent given by:  Patient    Risks discussed:  Bleeding, pain, infection and nerve damage  Universal protocol:     Procedure explained and questions answered to patient or proxy's satisfaction: yes      Immediately prior to procedure a time out was called: yes      Patient identity confirmed:  Verbally with patient, arm band and hospital-assigned identification number  Pre-procedure details:     Preparation: Preprocedure hand washing was performed; sterile field was maintained     Ultrasound was used to identify the vessel. It was assessed and patent. Ultrasound was used to visualize vascular needle entry into the vessel. Ultrasound used for needle placement, imaging, supervision, and interpretation    Skin Prep used: Chlorhexidine gluconate and Isopropyl alcohol  Anesthesia (see MAR for exact dosages):     Anesthesia method:  Under general anesthesia    A 20 G Catheter type: Arrow 1 and 3/4 inch in length,  Placed on the left  radial artery  using anatomical landmarks, palpation, guidewire and Seldinger With  number of attempts:1.Secured with: transparent dressing   MEDICATIONS:     Post-procedure details:    Patient tolerance of procedure:  Tolerated well, no immediate complications blood withdrawn easily, flushes easily and good waveform  Complications:  Performed By:  Performing provider: Minerva Areola, MD Authorizing provider: Kathryne Sharper, MD

## 2023-06-06 NOTE — OR Surgeon (Signed)
[facility] MEDICINE   DEPARTMENT OF UROLOGY   OPERATION SUMMARY     PATIENT NAME: [facility] NUMBER: [id number]   DATE OF SERVICE: 06/06/2023   DATE OF BIRTH: Mar 10, 1966    PREOPERATIVE DIAGNOSIS:   1. Favorable intermediate risk prostate cancer   2. Past medical history includes HTN, DM II, erectile dysfunction   3. Past surgical history includes bilateral knee surgery and colonoscopy (no abdominal surgeries)    POSTOPERATIVE DIAGNOSIS:   Same     PROCEDURES:   1.  Robotic-assisted laparoscopic radical prostatectomy (Da Vinci Single-Port System) (CPT [phone])    SURGEONS:   1.  Trinda Pascal, MD (Attending)  2.  Rex Kras, MD (PGY4), Jannifer Hick, MD (PGY4), and Berna Spare, MD (PGY1) (Resident)    ANESTHESIA:   General    ESTIMATED BLOOD LOSS:   50 mL    FLUIDS:   Per anesthesia    COMPLICATIONS:   None    DRAINS:   1. 20-French Foley Catheter (transurethral, 10 mL in balloon)      SPECIMENS:   1.  Prostate gland and bilateral seminal vesicles en bloc     INDICATIONS FOR PROCEDURE:   Samual Beals Goette is a 57 y.o. male with prostate cancer who presents today for robotic-assisted laparoscopic radical prostatectomy. Prior to the procedure today, the patient's history and physical was reviewed. Informed consent was obtained and all questions were satisfactorily answered. The risks of surgery including pain, blood loss requiring blood transfusion, delayed bleeding, hematoma, lymphocele, infection, bowel injury requiring immediate and/or future open surgery and the need for a temporary or permanent colostomy, ileus, incisional hernia, conversion to open surgery, potential nerve injury secondary to positioning, obturator nerve injury, and complications from anesthesia (heart attack, stroke, blood clot, pulmonary embolism, prolonged mechanical ventilation, and possibly death) were discussed. In addition, post-operatively, there is risk of impotence (refractory to medications requiring  a penile implant to achieve an erection) even if a nerve sparing procedure is performed, need for prolonged foley catheter, need for ureteral reimplant (needing a major surgery), leakage from the vesicourethral anastomosis, complete incontinence that would require an artificial urinary sphincter to be placed, sexual dysfunction, penile shortening, and the need for adjuvant or salvage radiation +/- hormonal therapy. There is also a risk that the cancer may return and despite aggressive treatment, may be incurable leading to death. The patient acknowledged and communicated understanding of these risks and wished to proceed with the procedure.     OPERATIVE FINDINGS:  1.  The prostate and seminal vesicles were removed en bloc.  2.  An intraoperative pressurized air leak test using a rectal tube showed no evidence of a rectal injury.   3. The bladder neck was successfully anastomosed to the urethra using a running 3-0 V-Loc suture. The bladder was irrigated with saline mixed with methylene blue dye with no leak identified at the vesicourethral anastomosis with 120 mL of irrigation.         DESCRIPTION OF THE PROCEDURE:    The patient was transferred to the operating room and general anesthesia was induced by the anesthesia team. An arterial line and appropriate peripheral IVs were placed by the anesthesia team. The patient was placed in the supine frog-leg position with a pillow placed underneath each leg. The patient was appropriately stabilized to the table with all compression points padded. His arms were tucked at his sides and foam padding was used to protect the arms and face. A lubricated  16-French red rubber catheter was inserted into the rectum and attached to tubing to be used for intraoperative pressurized air leak test during the case. The bed was tested in Trendelenburg position and the patient did not move on the table or experience any ventilation issues per anesthesia. His abdomen and external genitalia  were prepped and draped in the standard sterile fashion.  An appropriate surgical pause was performed and agreed on by all in the room verifying the correct patient, date of birth, medical record number, allergies, surgical procedure, and fire risk score. Intravenous antibiotics along with heparin were administered prior to anesthesia induction.  A 16-French transurethral Foley catheter was placed on the field and connected to a drainage bag. Using a 15-blade scalpel, a 3.5 cm vertical infraumbilical incision was made between the umbilicus and symphysis pubis. Next, a vertical incision was made through the anterior rectus fascia. The rectus muscles were bluntly separated in the midline.  We then incised the transversalis fascia and entered the space of Retzius. Blunt digital dissection was used to develop the space of Retzius.  A 12 mm assistant port was placed to the left of the initial incision.  Next, the access point for the single-port robot was placed.  The internal ring was advanced via the incision and the outer ring was hold in and the access point globe was applied.  We used an 8 mm AirSeal port via the side of the access point.  Insufflation was started via the AirSeal port up to 8 mm Hg. The SP Robotic Surgical System was then docked in place. Robotic instruments were introduced under direct vision into the surgical field.  We used the camera at 12 o'clock, scissor on the right hand, [place] bipolar forceps on the left-hand and fenestrated bipolar forceps at 6 o'clock position. We identified the pubic bone and endopelvic fascia. The endopelvic fascia was entered on both sides of the prostate and a plane was developed to the apex. The puboprostatic ligaments were transected with a combination of sharp dissection and electrocautery. We used an EndoGIA 45mm Gold Stapler load to ligate the dorsal venous complex. The bladder neck was then identified (by moving the Foley in and out, squeezing of the lateral  side of the bladder, and visual inspection of the bladder neck at the junction between the bladder and prostate). The bladder neck was then incised using electrocautery and the Foley catheter was identified. We divided the posterior bladder wall, while avoiding injury to the ureteral orifices. The right and left vas deferens were transected at this level and the seminal vesicles were dissected free from surrounding tissue using a combination of sharp and blunt dissection while minimizing electrocautery. We used sharp dissection to enter Denovillers fascia until we identified perirectal fat.  We then switched the robotic camera into 6 o'clock position. We developed a plane to the apex of the prostate using a combination of sharp and blunt dissection. Extra care and attention were taken to avoid a rectal injury during this dissection. The prostatic pedicles were transected with appropriate establishment of hemostasis all the way to the prostatic apex.  Next, we transitioned again to the camera at the 12 o'clock position. We then turned our attention to dissect the prostatic apex and transected the urethra taking care to preserve the external urinary sphincter as well ensuring all the apical tissue was removed. There was excellent hemostasis from the dorsal venous complex. The prostate was completely freed after transecting the urethra.  The prostate was brought  outside to the access point and was removed and sent for pathology.  Upon inspection, there did not appear to be any evidence of a rectal injury. We then performed a "rectal test" by filling the pelvis with irrigation and injecting pressurized air into the rectum via the previously placed rectal tube under laparoscopic direct vision. We did not observe any bubbles or evidence suggestive of a rectal injury. Next, we dissected the bladder laterally on both sides from the pelvic sidewall to be able to pull the bladder down for the anastomosis. We then began our  vesicourethral anastomosis.  The posterior rhabdosphincter was reconstructed with a 2-0 Vicryl suture, bringing the rhabdosphincter to the to the posterior bladder neck to allow a tension free anastomosis (Rocco Stitch).  We used two stitches of 3-0 V-Loc suture (CV-23) tied together at their ends. We started the anastomosis at 6 o'clock and ran it clockwise and counterclockwise with each needle respectively to meet at 12 o'clock. The ureteral orifices were visualized away from our suturing. The catheter was passed after each suture was passed to ensure the urethra was patent.  The two stitches were then tied together. A new 20-French Foley catheter was introduced and the balloon was filled with 10 mL of saline. The bladder was irrigated with saline with no leak from the bladder neck to urethra anastomosis was observed on filling the bladder with 120 mL of sterile saline mixed with methylene blue dye. We lowered the insufflation pressure to and ensured excellent hemostasis.  The robot was undocked. The fascia was closed with 0 PDS suture in a running fashion. The 12 mm assistant port and the midline infraumbilical incisions were closed with 2-0 Vicryl for subcutaneous closure and 4-0 Monocryl for the skin. Exofin skin glue was applied to all incisions. At the end of the procedure, anesthesia was reversed. All counts (needles, laps, instruments) were correct.     PLAN:    The patient tolerated the procedure well. He was extubated and transferred to the PACU in stable condition. The patient will be admitted to the hospital floor after meeting anesthesia criteria.     Jannifer Hick, MD    I was present and participated in all aspects of the patient's case.   I reviewed the resident's note.   I agree with the findings and plan of care as documented in the resident's note.   Any exceptions/additions are edited/noted.    Trinda Pascal, MD  Assistant Professor, Surgeon   Division of Urologic Oncology  Department of  Urology   [facility] Medicine

## 2023-06-06 NOTE — Anesthesia Preprocedure Evaluation (Signed)
ANESTHESIA PRE-OP EVALUATION  Planned Procedure: SINGLE PORT ROBOTIC PROSTATECTOMY (Abdomen)  SP ROBOT SUPPLY CARD  Review of Systems     anesthesia history negative               Pulmonary   sleep apnea and CPAP,   Cardiovascular    Hypertension and hyperlipidemia , Exercise Tolerance: > or = 4 METS        GI/Hepatic/Renal           Endo/Other      type 2 diabetes/ stable    Neuro/Psych/MS        Cancer  CA and prostate cancer,                       Physical Assessment      Airway       Mallampati: II    TM distance: >3 FB    Neck ROM: full  Mouth Opening: good.  Facial hair  Beard        Dental           (+) missing           Pulmonary    Breath sounds clear to auscultation       Cardiovascular    Rhythm: regular  Rate: Normal       Other findings              Plan  ASA 4     Planned anesthesia type: general     general anesthesia with endotracheal tube intubation      PONV Plan:  I plan to administer pharmcologic prophalaxis antiemetics          Additional Plans: Arterial line    Intravenous induction     Anesthesia issues/risks discussed are: Dental Injuries, Nerve Injuries, PONV, Art Line Placement, Cardiac Events/MI, Intraoperative Awareness/ Recall, Blood Loss, Sore Throat, Aspiration, Stroke and Post-op Pain Management.  Anesthetic plan and risks discussed with patient  signed consent obtained      Use of blood products discussed with patient who consented to blood products.      Patient's NPO status is appropriate for Anesthesia.           Plan discussed with attending and resident.

## 2023-06-06 NOTE — Anesthesia Postprocedure Evaluation (Signed)
Anesthesia Post Op Evaluation    Patient: Christian Williamson  Procedure(s):  SINGLE PORT ROBOTIC PROSTATECTOMY  SP ROBOT SUPPLY CARD    Last Vitals:Temperature: 36.3 C (97.3 F) (06/06/23 1230)  Heart Rate: 73 (06/06/23 1400)  BP (Non-Invasive): 116/64 (06/06/23 1400)  Respiratory Rate: 18 (06/06/23 1400)  SpO2: 99 % (06/06/23 1400)    No notable events documented.    Patient is sufficiently recovered from the effects of anesthesia to participate in the evaluation and has returned to their pre-procedure level.  Patient location during evaluation: PACU       Patient participation: complete - patient participated  Level of consciousness: awake and alert and responsive to verbal stimuli    Pain management: adequate  Airway patency: patent    Anesthetic complications: no  Cardiovascular status: acceptable  Respiratory status: acceptable  Hydration status: acceptable  Patient post-procedure temperature: Pt Normothermic   PONV Status: Absent

## 2023-06-06 NOTE — Anesthesia Transfer of Care (Signed)
ANESTHESIA TRANSFER OF CARE   Christian Williamson is a 57 y.o. 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 No issue as adult.    History of kidney disease     stones    HTN (hypertension)     controlled with medication    Hyperlipidemia     recently started on crestor    PTSD (post-traumatic stress disorder)     Sleep apnea     Type 2 diabetes mellitus (CMS HCC)     Wears glasses       Allergy History as of 06/06/23       POISON OAK EXTRACT         Noted Status Severity Type Reaction    05/15/23 1223 Aram Beecham, RN 05/15/23 Active High  Hives/ Urticaria    Comments: "Poison Oak"                   I completed my transfer of care / handoff to the receiving personnel during which we discussed:  Access, Airway, All key/critical aspects of case discussed, Analgesia, Antibiotics, Expectation of post procedure, Fluids/Product, Gave opportunity for questions and acknowledgement of understanding, Labs and PMHx      Post Location: PACU                        Additional Info:Patient transferred to PACU bed #16 in stable condition at this time. Sign out given to bedside nurse and all questions were answered.    Ladona Horns, MD  Anesthesiology Resident PGY-1  06/06/2023                                           Last OR Temp: Temperature: 36.2 C (97.2 F)  ABG:  PH (ARTERIAL)   Date Value Ref Range Status   06/06/2023 7.37 7.35 - 7.45 Final     PCO2 (ARTERIAL)   Date Value Ref Range Status   06/06/2023 38 35 - 45 mm/Hg Final     PO2 (ARTERIAL)   Date Value Ref Range Status   06/06/2023 146  (H) 83 - 108 mm/Hg Final     SODIUM   Date Value Ref Range Status   06/06/2023 131 (L) 136 - 145 mmol/L Final     POTASSIUM   Date Value Ref Range Status   03/18/2023 3.6 3.5 - 5.1 mmol/L Final     KETONES   Date Value Ref Range Status   03/18/2023 Negative Negative, Trace mg/dL Final     WHOLE BLOOD POTASSIUM   Date Value Ref Range Status   06/06/2023 4.6 3.5 - 5.1 mmol/L Final     CHLORIDE   Date Value Ref Range Status   06/06/2023 106 98 -  107 mmol/L Final     CALCIUM   Date Value Ref Range Status   03/18/2023 9.1 8.6 - 10.3 mg/dL Final     Calculated P Axis   Date Value Ref Range Status   03/18/2023 3 degrees Final     Calculated R Axis   Date Value Ref Range Status   03/18/2023 63 degrees Final     Calculated T Axis   Date Value Ref Range Status   03/18/2023 32 degrees Final     IONIZED CALCIUM   Date Value Ref Range Status   06/06/2023 1.18 1.15 - 1.33 mmol/L Final     LACTATE   Date Value Ref Range Status   06/06/2023 1.2 <=1.9 mmol/L Final     HEMOGLOBIN   Date Value Ref Range Status   06/06/2023 13.8 12.0 - 18.0 g/dL Final     OXYHEMOGLOBIN   Date Value Ref Range Status   06/06/2023 97.2 (H) 90.0 - 95.0 % Final     CARBOXYHEMOGLOBIN   Date Value Ref Range Status   06/06/2023 1.1 <=3.0 % Final     MET-HEMOGLOBIN   Date Value Ref Range Status   06/06/2023 1.1 <=1.5 % Final     BASE DEFICIT   Date Value Ref Range Status   06/06/2023 2.9 0.0 - 3.0 mmol/L Final     BICARBONATE (ARTERIAL)   Date Value Ref Range Status   06/06/2023 22.7 21.0 - 28.0 mmol/L Final     Airway:* No LDAs found *  Blood pressure 110/63, pulse 74, temperature 36.2 C (97.2 F), resp. rate (!) 22, height 1.676 m (5\' 6" ), weight 78.2 kg (172 lb 6.4 oz), SpO2 96%.

## 2023-06-07 ENCOUNTER — Other Ambulatory Visit: Payer: Self-pay

## 2023-06-07 DIAGNOSIS — Z9889 Other specified postprocedural states: Secondary | ICD-10-CM

## 2023-06-07 LAB — BASIC METABOLIC PANEL
ANION GAP: 6 mmol/L (ref 4–13)
BUN/CREA RATIO: 19 (ref 6–22)
BUN: 16 mg/dL (ref 8–25)
CALCIUM: 9 mg/dL (ref 8.6–10.2)
CHLORIDE: 109 mmol/L (ref 96–111)
CO2 TOTAL: 23 mmol/L (ref 22–30)
CREATININE: 0.83 mg/dL (ref 0.75–1.35)
ESTIMATED GFR - MALE: >90 mL/min/BSA (ref >=60)
GLUCOSE: 115 mg/dL (ref 65–125)
POTASSIUM: 4 mmol/L (ref 3.5–5.1)
SODIUM: 138 mmol/L (ref 136–145)

## 2023-06-07 LAB — CBC WITH DIFF
BASOPHIL #: <0.1 10*3/uL (ref <=0.20)
BASOPHIL %: 0.4 %
EOSINOPHIL #: <0.1 10*3/uL (ref <=0.50)
EOSINOPHIL %: 0.6 %
HCT: 36.7 % — ABNORMAL LOW (ref 38.9–52.0)
HGB: 12.5 g/dL — ABNORMAL LOW (ref 13.4–17.5)
IMMATURE GRANULOCYTE #: <0.1 10*3/uL (ref <0.10)
IMMATURE GRANULOCYTE %: 0.5 % (ref 0.0–1.0)
LYMPHOCYTE #: 2.01 10*3/uL (ref 1.00–4.80)
LYMPHOCYTE %: 16.3 %
MCH: 27.8 pg (ref 26.0–32.0)
MCHC: 34.1 g/dL (ref 31.0–35.5)
MCV: 81.7 fL (ref 78.0–100.0)
MONOCYTE #: 0.97 10*3/uL (ref 0.20–1.10)
MONOCYTE %: 7.9 %
MPV: 10.9 fL (ref 8.7–12.5)
NEUTROPHIL #: 9.19 10*3/uL — ABNORMAL HIGH (ref 1.50–7.70)
NEUTROPHIL %: 74.3 %
PLATELETS: 228 10*3/uL (ref 150–400)
RBC: 4.49 10*6/uL — ABNORMAL LOW (ref 4.50–6.10)
RDW-CV: 13 % (ref 11.5–15.5)
WBC: 12.4 10*3/uL — ABNORMAL HIGH (ref 3.7–11.0)

## 2023-06-07 MED ORDER — HYDROCODONE 5 MG-ACETAMINOPHEN 325 MG TABLET
1.0000 | ORAL_TABLET | Freq: Four times a day (QID) | ORAL | 0 refills | Status: DC | PRN
Start: 2023-06-07 — End: 2024-08-13
  Filled 2023-06-07: qty 4, 1d supply, fill #0

## 2023-06-07 MED ORDER — KETOROLAC 10 MG TABLET
10.0000 mg | ORAL_TABLET | Freq: Four times a day (QID) | ORAL | 0 refills | Status: DC | PRN
Start: 2023-06-07 — End: 2024-08-13
  Filled 2023-06-07: qty 6, 2d supply, fill #0

## 2023-06-07 MED ORDER — SENNOSIDES 8.6 MG TABLET
8.6000 mg | ORAL_TABLET | Freq: Every evening | ORAL | 0 refills | Status: AC
Start: 2023-06-07 — End: 2023-06-14
  Filled 2023-06-07: qty 7, 7d supply, fill #0

## 2023-06-07 MED ORDER — SENNOSIDES 8.6 MG TABLET
8.6000 mg | ORAL_TABLET | Freq: Every evening | ORAL | 0 refills | Status: DC
Start: 2023-06-07 — End: 2023-06-07
  Filled 2023-06-07: qty 7, 7d supply, fill #0

## 2023-06-07 MED ORDER — CIPROFLOXACIN 500 MG TABLET
500.0000 mg | ORAL_TABLET | Freq: Two times a day (BID) | ORAL | 0 refills | Status: AC
Start: 2023-06-07 — End: 2023-06-08
  Filled 2023-06-07: qty 1, 1d supply, fill #0

## 2023-06-07 MED ORDER — CIPROFLOXACIN 500 MG TABLET
500.0000 mg | ORAL_TABLET | Freq: Two times a day (BID) | ORAL | 0 refills | Status: DC
Start: 2023-06-07 — End: 2023-06-07
  Filled 2023-06-07: qty 1, 1d supply, fill #0

## 2023-06-07 NOTE — Discharge Instructions (Addendum)
Follow discharge instructions as advised in avs

## 2023-06-07 NOTE — Progress Notes (Signed)
[facility] MEDICINE  UROLOGY PROGRESS NOTE   Patient: Christian Williamson, Christian Williamson, 57 y.o. male  Date of Admission:  06/06/2023  Date of Birth:  28-Sep-1966  Date of Service:  06/07/2023     ASSESSMENT:    57 y.o. male:    1. Favorable intermediate risk prostate cancer   02/2023 - MRI Prostate [facility]):  32 cc, PI-RADS 5 lesion   03/2023 - MRI fusion TR [facility]): 32 cc, ASAP 5/14 cores, GG1 2/14 cores, GG2 1/14 cores   2. Past medical history includes HTN, DM II, erectile dysfunction   3. Past surgical history includes bilateral knee surgery and colonoscopy (no abdominal surgeries)    Now POD#1 status post:    1. Robotic-assisted laparoscopic radical prostatectomy (Da Vinci Single-Port System)      PLAN:   - Continue regular diet  - Maintain foley catheter at discharge  - Discontinue maintenance intravenous fluids  - Will follow up in 2 weeks  - One time dose of antibiotic for catheter removal at follow up    Diet: Regular   Anticoagulation: Heparin subq   Pain regimen: Gabapentin, Tylenol PRN, Norco PRN   Bowel regimen: Senokot   Foley catheter: Maintain   Drain: None   Antibiotics: None   IV fluids: Discontinued   Microbiology: None   Imaging: None   PT/OT recs: Ordered     DISPOSITION: Floor status, anticipate discharge today     SUBJECTIVE:   No acute events overnight. Pain controlled with oral pain medications. Patient tolerating regular diet without nausea or emesis. No fevers or chills. Feeling well and ready to go home.     OBJECTIVE:   Temperature: 37 C (98.6 F)  Heart Rate: 82  BP (Non-Invasive): (!) 101/57  Respiratory Rate: 18  SpO2: 96 %  PHYSICAL EXAM:  General: 57 y.o. male not in acute distress.    Skin: Warm and dry.     Eyes: Eyes are clear.    Pulmonary: Respiratory effort is unlabored on room air.    Psychiatric: Patient is alert, appropriate mood.    Cardiovascular: Appears well perfused.    Neurologic: CN II-XII grossly intact, awake and alert.   Gastrointestinal: Abdomen soft,  appropriately tender, non-distended, incisions clean/dry/intact.   Genitourinary: Foley catheter in place draining clear yellow urine.        ATTESTATION:   Berna Spare, MD  PGY-1, Department of Urology    I was immediately available for this patient encounter.  I reviewed the resident's note.   I agree with the findings and plan of care as documented in the resident's note.   Any exceptions/additions are edited/noted.    Trinda Pascal, MD  Assistant Professor, Surgeon   Division of Urologic Oncology  Department of Urology   [facility] Medicine

## 2023-06-07 NOTE — Care Plan (Signed)
[facility]  Rehabilitation Services  Occupational Therapy Initial Evaluation    Patient Name: Christian Williamson  Date of Birth: May 26, 1966  Height: Height: 167.6 cm (5\' 6" )  Weight: Weight: 80.2 kg (176 lb 12.9 oz)  Room/Bed: 820/A  Payor: [facility] [employer] / Plan: [employer]/OPTUM / Product Type: Managed Care /     Assessment:   Christian Williamson performed very well during OT evaluation this date. Pt presents POD 1 s/p robotic-assisted laparoscopic radical prostatectomy. From a functional standpoint, pt appears to be functioning likely at/near his stated bsaeline regarding safety and IND with mobility and ADL routine. He is mildly limited by chronic back and L knee pain as well as some post-op discomfort, though was able to participate in functional bed mobility, STS transfers, standing grooming ADLs at sink level, and ambulation of greater than household distance with SBA provided for safety though no overt LOB noted throughout. From OT perspective, anticipate safe d/c home with assist once medically appropriate with no acute OT concerns or DME needs. OT will sign off at this time.      Discharge Needs:   Equipment Recommendation: none anticipated    Discharge Disposition: home with assist    Plan:   Current Intervention:  Discharge OT services.    Subjective & Objective        06/07/23 0834   Therapist Pager   OT Assigned/ Pager # Gabor Lusk [phone]   Rehab Session   Document Type evaluation   OT Visit Date 06/07/23   Total OT Minutes: 11   Patient Effort good   Symptoms Noted During/After Treatment fatigue   General Information   Patient Profile Reviewed yes   Onset of Illness/Injury or Date of Surgery 06/06/23   Pertinent History of Current Functional Problem 57 y.o. male POD 1 s/p robotic-assisted laparoscopic radical prostatectomy.   Medical Lines PIV Line;Telemetry;Foley Catheter   Respiratory Status room air   Existing Precautions/Restrictions full code;fall precautions   Pre Treatment Status   Pre  Treatment Patient Status Patient supine in bed;Call light within reach;Telephone within reach;Patient safety alarm activated;Nurse approved session   Support Present Pre Treatment  None   Communication Pre Treatment  Nurse   Communication Pre Treatment Comment Cleared for participation per bedside RN, Isaias Cowman. Seen alongside PT for professional assistance.   Mutuality/Individual Preferences   Individualized Care Needs OOB with assist x1   Patient-Specific Goals (Include Timeframe) To get better and go home   Plan of Care Reviewed With patient   Living Environment   Lives With spouse   Living Arrangements house   Home Assessment: Stairs in Home   Home Accessibility stairs to enter home;stairs (2 railings present);tub/shower is not walk in   Transportation Available car;family or friend will provide   Living Environment Comment Pt lives with his wife in a house with main level setup and B HRs present.   Home Main Entrance   Number of Stairs, Main Entrance seven   Stair Railings, Main Entrance railings on both sides of stairs   Functional Level Prior   Ambulation 1 - assistive equipment   Transferring 1 - assistive equipment   Toileting 0 - independent   Bathing 0 - independent   Dressing 0 - independent   Eating 0 - independent   Communication 0 - understands/communicates without difficulty   Prior Functional Level Comment Pt is generally fully independent with all aspects of functional mobility, ADLs, IADLs with occasional use of a straight cane d/t  chronic L knee pain. Tub/shower combo for bathing tasks.   Self-Care   Equipment Currently Used at Home yes   Equipment Currently Used at [employer], straight   Equipment Brought to Hospital none   Vital Signs   O2 Delivery Pre Treatment room air   O2 Delivery Post Treatment room air   Vitals Comment No signs or symptoms of distress on RA.   Pain Assessment   Pre/Posttreatment Pain Comment R lower abdomen/flank discomfort though did not formally rate.   Coping/Psychosocial    Observed Emotional State calm;cooperative;pleasant   Verbalized Emotional State acceptance   Coping/Psychosocial Response Interventions   Plan Of Care Reviewed With patient   Cognitive Assessment/Interventions   Behavior/Mood Observations alert;behavior appropriate to situation, WNL/WFL;cooperative   Orientation Status oriented x 4   Attention WNL/WFL   Follows Commands WNL;WFL   RUE Assessment   RUE Assessment WFL- Within Functional Limits   LUE Assessment   LUE Assessment WFL- Within Functional Limits   RLE Assessment   RLE Assessment WFL- Within Functional Limits   LLE Assessment   LLE Assessment WFL- Within Functional Limits   Trunk Assessment   Trunk Assessment WFL-Within Functional Limits   Mobility Assessment/Training   Mobility Comment Pt ambulated functional mobility distance of 250 feet with SBA provided for safety though no overt LOB or unsteadiness noted throughout.   Bed Mobility Assessment/Treatment   Bed Mobility, Assistive Device Head of Bed Elevated   Supine-Sit Independence stand-by assistance   Sit to Supine, Independence stand-by assistance   Impairments endurance   Transfer Assessment/Treatment   Sit-Stand Independence stand-by assistance   Stand-Sit Independence stand-by assistance   Transfer Impairments endurance   ADL Assessment/Intervention   ADL Comments standing grooming ADLs at sink level   Lower Body Dressing Assessment/Training   Comment Pt discussed that he generally wears slide-in Skechers d/t difficulty bending in context of chronic back and L knee pain.   Grooming/Oral Hygeine  Assessment/Training   Position standing   Independence Level standby assist   Impairments activity tolerance impaired   Comment Oral hygiene task while standing at sink level with setup A and SBA provided for safety.   Motor Skills/Interventions   Additional Documentation Functional Endurance (Group)   Balance   Sitting Balance: Static good balance   Sitting, Dynamic (Balance) fair + balance   Sit-to-Stand  Balance fair + balance   Standing Balance: Static fair + balance   Standing Balance: Dynamic fair + balance   Functional Endurance Training   Comment, Functional Endurance fair +   Post Treatment Status   Post Treatment Patient Status Patient supine in bed;Call light within reach;Telephone within reach;Patient safety alarm activated   Support Present Post Treatment  None   Communication Post Treatement Nurse   Clinical Impression   Functional Level at Time of Session Christian Williamson performed very well during OT evaluation this date. Pt presents POD 1 s/p robotic-assisted laparoscopic radical prostatectomy. From a functional standpoint, pt appears to be functioning likely at/near his stated bsaeline regarding safety and IND with mobility and ADL routine. He is mildly limited by chronic back and L knee pain as well as some post-op discomfort, though was able to participate in functional bed mobility, STS transfers, standing grooming ADLs at sink level, and ambulation of greater than household distance with SBA provided for safety though no overt LOB noted throughout. From OT perspective, anticipate safe d/c home with assist once medically appropriate with no acute OT concerns or DME needs. OT will  sign off at this time.   Criteria for Skilled Therapeutic Interventions Met (OT) no;no problems identified which require skilled intervention   Therapy Frequency Evaluation Only   Predicted Duration of Therapy evaluation only   Anticipated Equipment Needs at Discharge none anticipated   Anticipated Discharge Disposition home with assist   Highest level of Mobility score   Exercise/Activity Level Performed 8- Walked 250 feet or more   Evaluation Complexity Justification   Occupational Profile Review Brief history   Performance Deficits 1-3 deficits;Mobility   Clinical Decision Making Low analytic complexity   Evaluation Complexity Low   Discharge Summary, OT Eval   Reason for Discharge no further needs identified       Therapist:    Jabreel Chimento, MOT, OTR/L   Pager #: [phone]

## 2023-06-07 NOTE — Discharge Summary (Signed)
[facility]                                              DISCHARGE SUMMARY      PATIENT NAME:  Christian Williamson  MRN:  [id number]  DOB:  Sep 06, 1966    ADMISSION DATE:  06/06/2023  DISCHARGE DATE:  06/07/2023    ATTENDING PHYSICIAN: Trinda Pascal, MD  PRIMARY CARE PHYSICIAN: [facility]    ADMISSION DIAGNOSIS:   1. Favorable intermediate risk prostate cancer   02/2023 - MRI Prostate [facility]):  32 cc, PI-RADS 5 lesion   03/2023 - MRI fusion [facility]): 32 cc, ASAP 5/14 cores, GG1 2/14 cores, GG2 1/14 cores   2. Past medical history includes HTN, DM II, erectile dysfunction   3. Past surgical history includes bilateral knee surgery and colonoscopy (no abdominal surgeries)    DISCHARGE DIAGNOSIS: Same  Active Hospital Problems   (*Primary Problem)    Diagnosis    *Prostate cancer (CMS HCC)     Chronic  Problems    Abnormal MRI, pelvis         Date Noted: 03/14/2023                                                     DISCHARGE MEDICATIONS:     Current Discharge Medication List        START taking these medications.        Details   ciprofloxacin HCl 500 mg Tablet  Commonly known as: CIPRO   500 mg, Oral, 2 TIMES DAILY, This antibiotic pill should be taken while you are in the waiting room prior to your follow-up appointment.  Qty: 1 Tablet  Refills: 0     HYDROcodone-acetaminophen 5-325 mg Tablet  Commonly known as: NORCO   1 Tablet, Oral, EVERY 6 HOURS PRN  Qty: 4 Tablet  Refills: 0     ketorolac tromethamine 10 mg Tablet  Commonly known as: TORADOL   10 mg, Oral, EVERY 6 HOURS PRN  Qty: 6 Tablet  Refills: 0     sennosides 8.6 mg Tablet  Commonly known as: SENNA   8.6 mg, Oral, EVERY EVENING  Qty: 7 Tablet  Refills: 0            CONTINUE these medications - NO CHANGES were made during your visit.        Details   albuterol sulfate 90 mcg/actuation oral inhaler  Commonly known as: PROVENTIL or VENTOLIN or PROAIR   2 Puffs, Inhalation, EVERY 6 HOURS  PRN  Refills: 0     busPIRone 10 mg Tablet  Commonly known as: BUSPAR   10 mg, Oral, 3 TIMES DAILY  Refills: 0     cholecalciferol (vitamin D3) 50 mcg (2,000 unit) Tablet   5 Tablets, Oral, DAILY  Refills: 0     cyanocobalamin 1,000 mcg Tablet  Commonly known as: VITAMIN B 12   1 Tablet, Oral, DAILY  Refills: 0     DULoxetine 60 mg Capsule, Delayed Release(E.C.)  Commonly known as: CYMBALTA DR   60 mg, Oral, 2 TIMES DAILY  Refills: 0     empagliflozin 25 mg Tablet  Commonly known as: JARDIANCE  1 Tablet, Oral, DAILY, Takes in am   Refills: 0     gabapentin 400 mg Capsule  Commonly known as: NEURONTIN   400 mg, Oral, 2 TIMES DAILY  Refills: 0     gemfibroziL 600 mg Tablet  Commonly known as: LOPID   600 mg, Oral, 2 TIMES DAILY BEFORE MEALS  Refills: 0     glipiZIDE 5 mg Tablet  Commonly known as: GLUCOTROL   5 mg, Oral, EVERY MORNING BEFORE BREAKFAST  Refills: 0     hydroCHLOROthiazide 12.5 mg Capsule  Commonly known as: MICROZIDE   12.5 mg, Oral, EVERY EVENING  Refills: 0     lisinopriL-hydrochlorothiazide 20-25 mg Tablet  Commonly known as: ZESTORETIC   1 Tablet, Oral, DAILY, Takes in am   Refills: 0     MetFORMIN 1,000 mg Tablet  Commonly known as: GLUCOPHAGE   1 Tablet, Oral, 2 TIMES DAILY  Refills: 0     metoprolol succinate 100 mg Tablet Sustained Release 24 hr  Commonly known as: TOPROL-XL   100 mg, Oral, DAILY, Takes in am   Refills: 0     mirtazapine 15 mg Tablet  Commonly known as: REMERON   15 mg, NIGHTLY  Refills: 0     prazosin 1 mg Capsule  Commonly known as: MINIPRESS   1 mg, EVERY EVENING  Refills: 0     rosuvastatin 20 mg Tablet  Commonly known as: CRESTOR   20 mg, Oral, DAILY, Takes in am   Refills: 0     venlafaxine 100 mg Tablet  Commonly known as: EFFEXOR   100 mg, Oral, 3 TIMES DAILY  Refills: 0            DISCHARGE INSTRUCTIONS:      AMB CONSULT/REFERRAL UROLOGY/[facility])   Referral Type: Physician Referral-Office Visits   Number of Visits Requested: 1     DISCHARGE INSTRUCTION - DIET      Diet: RESUME HOME DIET      DISCHARGE INSTRUCTION - ACTIVITY     Activity: NO LIFTING OVER 10 POUNDS      DISCHARGE INSTRUCTION - REGARDING SHOWERING AND INCISION CARE    You may shower tomorrow. Keep your incisions clean, dry, and open to   air. You should let clean, soapy water to run over the incisions at least daily. No swimming or   submerging the wound such as in a hot tub, pool or pond. Pat the wound dry. Do not rub your   wound. Please seek medical attention if: There is redness, swelling, or increased pain in the   wound that is not controlled with pain medications. There is drainage, blood or pus coming   from your wound lasting for longer than 1 day or sooner if there is concern. You develop signs   of generalized infection including muscle aches, chills, fever, or a general ill feeling. You notice a   foul smell coming from the wound or dressing. You develop persistent nausea or vomiting.     DISCHARGE INSTRUCTION - SIGNS AND SYMPTOMS OF INFECTION    Please watch your incision/wound   site for the following signs of potential infection: increased redness or warmth. Drainage from   the wound that may be foul smelling, cloudy, yellow or green in color. Bulging or increased   swelling at the incision site. A temperature of more than 101.61F by mouth for two readings   taken 4 hours apart. A sudden increase in pain at the wound that is  not relieved with pain   medication.     DISCHARGE INSTRUCTION - POST-SURGICAL PAIN    These recommendations are based on the   assumption that you have no medical contra-indications to receive (non-steroidal antiinflammatory drugs) NSAIDs such as ibuprofen or acetaminophen. If you have been previously   told not to take these medications, please ignore these instructions. Acetaminophen 650mg    may be administered every 4 hours. Your dosage should not exceed 4 grams in one day. This   may be taken on a scheduled basis for the first 24 hours following discharge. This may  also be   taken as needed, but not to exceed the above recommendations. 800mg  Ibuprofen may be   taken on an as needed basis every 6 hours. You should not exceed 3.2 grams in one day. You   may alternate acetaminophen and ibuprofen for 24 hours. For example, at noon take one   800mg  ibuprofen and 625mg  of acetominophen. At 4pm take another 625mg  of   acetominophen. At 6pm take 800mg  of ibuprofen again. This regimen may be conducted for 24   hours. If you have been prescribed a pain medication containing acetaminophen (norco or   Percocet) you must be sure not to exceed the recommended daily amount.  -Discharge Instruction - Opioids - You have been prescribed a course of opioid medications for   post-operative pain. These medications should only be used for severe pain not well controlled   with other medications. They can cause increased confusion, lethargy     DISCHARGE INSTRUCTION - ANTIBIOTIC    You have been prescribed a one time dose of ciprofloxacin to be taken prior to removal of your foley catheter. This antibiotic pill should be taken while you are in the waiting room at your follow-up appointment in Urologic Oncology clinic.     REASON FOR HOSPITALIZATION AND HOSPITAL COURSE:    57 y.o. male with above history who presented to [facility] for planned surgical intervention. The patient was taken to the OR on 06/06/2023 for:     1. Robotic-assisted laparoscopic radical prostatectomy ([employer] Single-Port System)      The patient tolerated the procedure well and was transferred to the PACU in stable condition. He was admitted to the Urology Service for pain control and close post-operative monitoring. The patient progressed well throughout his hospitalization and by post-operative day 1, he was tolerating a regular diet, his pain was controlled with PRN PO medication, and he was ambulating without difficulty. Based upon clinical findings and examination, he was deemed appropriate to discharge. The patient will be  discharged with oral pain medication, stool softeners, and a one-time dose of antibiotics prior to catheter removal. The patient was instructed to call our [facility] Urology office at [phone] with any questions, concerns, or to confirm future appointments. The patient was instructed to call 911 or present to the emergency room if he develops increased pain, fever, nausea, vomiting, or if his overall generall condition worsens. Discharge instructions were reviewed with the patient and all questions were answered. He will follow-up in [facility] Urologic Oncology Clinic with an APP in 2 weeks for post-operative evaluation and foley catheter removal.     DISCHARGE DISPOSITION:  Home discharge     cc: Primary Care Physician:  [facility]  [address]  [place] [address]      Berna Spare, MD  PGY-1, Department of Urology    I was immediately available for this patient encounter.  I reviewed  the resident's note.   I agree with the findings and plan of care as documented in the resident's note.   Any exceptions/additions are edited/noted.    Trinda Pascal, MD  Assistant Professor, Surgeon   Division of Urologic Oncology  Department of Urology   [facility] Medicine

## 2023-06-07 NOTE — Care Plan (Signed)
[facility]  Rehabilitation Services  Physical Therapy Initial Evaluation    Patient Name: Christian Williamson  Date of Birth: 1966/10/14  Height: Height: 167.6 cm (5\' 6" )  Weight: Weight: 80.2 kg (176 lb 12.9 oz)  Room/Bed: 820/A  Payor: [facility] [employer] / Plan: [employer] / Product Type: Managed Care /     Assessment:      Mr. Streight tolerated PT evaluation well on this date, demonstrates slight decrease in overall activity tolerance and transfers/ambulation ability from baseline 2/2 increased fatigue and pain noted at this time. However, anticipate pt will progress well with therapy and that pt will be able to return home with assist once medically ready for d/c    Discharge Needs:    Equipment Recommendation: none anticipated  Discharge Disposition: home with assist    JUSTIFICATION OF DISCHARGE RECOMMENDATION   Based on current diagnosis, functional performance prior to admission, and current functional performance, this patient requires continued PT services in home with assist in order to achieve significant functional improvements in these deficit areas: aerobic capacity/endurance, gait, locomotion, and balance, motor function, muscle performance, neuromuscular, posture, ROM (range of motion).    Plan:   Current Intervention: balance training, home exercise program, gait training, bed mobility training, motor coordination training, neuromuscular re-education, patient/family education, postural re-education, ROM (range of motion), stair training, strengthening, stretching, transfer training  To provide physical therapy services 1x/day, minimum of 1x/week  for duration of until discharge.    The risks/benefits of therapy have been discussed with the patient/caregiver and he/she is in agreement with the established plan of care.       Subjective & Objective        06/07/23 0836   Therapist Pager   PT Assigned/ Pager # Jill Alexanders [phone]   Rehab Session   Document Type evaluation   PT Visit Date  06/07/23   Total PT Minutes: 11   Patient Effort good   Symptoms Noted During/After Treatment fatigue   General Information   Patient Profile Reviewed yes   Onset of Illness/Injury or Date of Surgery 06/06/23   Pertinent History of Current Functional Problem POD#1 status post:     1. Robotic-assisted laparoscopic radical prostatectomy ([employer] Single-Port System)   Medical Lines PIV Line;Telemetry;Foley Catheter   Respiratory Status room air   Existing Precautions/Restrictions full code;fall precautions   General Observations of Patient Pt supine in bed upon arrival, pleasant and cooperative. Agreeable to therapy at this time   Mutuality/Individual Preferences   Individualized Care Needs OOb SBA x1   Patient-Specific Goals (Include Timeframe) get better, get home   Plan of Care Reviewed With patient   Living Environment   Lives With spouse   Living Arrangements house   Home Assessment: Stairs in Home   Home Accessibility stairs to enter home;stairs within home   Transportation Available family or friend will provide   Stairs Within Home, Primary   Stairs, Within Home, Primary 12-14   Home Main Entrance   Number of Stairs, Main Entrance seven   Functional Level Prior   Ambulation 1 - assistive equipment   Transferring 1 - assistive equipment   Toileting 0 - independent   Bathing 0 - independent   Dressing 0 - independent   Eating 0 - independent   Communication 0 - understands/communicates without difficulty   Prior Functional Level Comment typically fully independent at his baseline, does use cane at time for his knee pain   Self-Care   Dominant  Hand right   Usual Activity Tolerance good   Current Activity Tolerance moderate   Equipment Currently Used at Home yes   Equipment Currently Used at Home cane, straight   Equipment Brought to Hospital none   Pre Treatment Status   Pre Treatment Patient Status Patient supine in bed;Call light within reach;Telephone within reach;Patient safety alarm activated;Nurse approved  session   Support Present Pre Treatment  None   Communication Pre Treatment  Nurse   Communication Pre Treatment Comment RN approved PT and OT session   Cognitive Assessment/Interventions   Behavior/Mood Observations behavior appropriate to situation, WNL/WFL   Orientation Status oriented x 4   Attention WNL/WFL   Follows Commands WNL   Vital Signs   O2 Delivery Pre Treatment room air   O2 Delivery Post Treatment room air   Vitals Comment no s/s of distress noted on RA   Pain Assessment   Pre/Posttreatment Pain Comment reports R lower abdomen/flank pain but did not rate at this time   RLE Assessment   RLE Assessment WFL- Within Functional Limits   LLE Assessment   LLE Assessment WFL- Within Functional Limits   Bed Mobility Assessment/Treatment   Bed Mobility, Assistive Device Head of Bed Elevated   Supine-Sit Independence stand-by assistance   Sit to Supine, Independence not tested   Safety Issues impaired trunk control for bed mobility   Impairments balance impaired;endurance;flexibility decreased;pain;postural control impaired;ROM decreased;strength decreased   Transfer Assessment/Treatment   Sit-Stand Independence stand-by assistance   Stand-Sit Independence stand-by assistance   Sit-Stand-Sit, Assist Device side by side   Bed-Chair Independence stand-by assistance   Bed-Chair-Bed Assist Device side by side   Transfer Safety Issues balance decreased during turns;sequencing ability decreased;step length decreased;weight-shifting ability decreased   Transfer Impairments balance impaired;coordination impaired;endurance;flexibility decreased;ROM decreased;postural control impaired;pain;strength decreased   Gait Assessment/Treatment   Total Distance Ambulated 250   Independence  stand-by assistance   Assistive Device  side by side   Distance in Feet 250   Gait Speed decreased/slowed   Deviations  cadence decreased;narrow BOS;step length decreased   Safety Issues  balance decreased during turns;sequencing ability  decreased;step length decreased   Impairments  balance impaired;coordination impaired;endurance;flexibility decreased;pain;postural control impaired;ROM decreased;strength decreased   Balance   Comment with SBA   Sitting Balance: Static good balance   Sitting, Dynamic (Balance) fair + balance   Sit-to-Stand Balance fair + balance   Standing Balance: Static fair + balance   Standing Balance: Dynamic fair + balance   Systems Impairment Contributing to Balance Disturbance musculoskeletal;neuromuscular   Identified Impairments Contributing to Balance Disturbance impaired postural control;pain;decreased ROM;decreased strength   Functional Endurance Training   Comment, Functional Endurance fair +   Post Treatment Status   Post Treatment Patient Status Patient sitting in bedside chair or w/c;Call light within reach;Telephone within reach;Patient safety alarm activated   Support Present Post Treatment  None   Communication Post Treatement Nurse   Communication Post Treatment Comment pt performance   Plan of Care Review   Plan Of Care Reviewed With patient   Basic Mobility Am-PAC/6Clicks Score (APPROVED Staff)   Turning in bed without bedrails 4   Lying on back to sitting on edge of flat bed 4   Moving to and from a bed to a chair 3   Standing up from chair 3   Walk in room 3   Climbing 3-5 steps with railing 3   6 Clicks Raw Score total 20   Standardized (t-scale) score 43.99  Patient Mobility Goal [facility]) 6- Walk 10 steps or more 2X/day   Exercise/Activity Level Performed 8- Walked 250 feet or more   Functional Impairment   Overall Functional Impairments/Problem List balance impaired;endurance;coordination impaired;flexibility decreased;pain;postural control impaired;ROM decreased;strength decreased   Physical Therapy Clinical Impression   Assessment Mr. Lisenby tolerated PT evaluation well on this date, demonstrates slight decrease in overall activity tolerance and transfers/ambulation ability from baseline 2/2 increased  fatigue and pain noted at this time. However, anticipate pt will progress well with therapy and that pt will be able to return home with assist once medically ready for d/c   Patient/Family Goals Statement get better, get home   Criteria for Skilled Therapeutic yes;meets criteria;skilled treatment is necessary   Pathology/Pathophysiology Noted musculoskeletal;neuromuscular   Impairments Found (describe specific impairments) aerobic capacity/endurance;gait, locomotion, and balance;motor function;muscle performance;neuromuscular;posture;ROM (range of motion)   Functional Limitations in Following  self-care;home management   Disability: Inability to Perform community/leisure   Rehab Potential good   Therapy Frequency 1x/day;minimum of 1x/week   Predicted Duration of Therapy Intervention (days/wks) until discharge   Anticipated Equipment Needs at Discharge (PT) none anticipated   Anticipated Discharge Disposition home with assist   Evaluation Complexity Justification   Patient History: Co-morbidity/factors that impact Plan of Care 1-2 that impact Plan of Care   Examination Components 4 or more Exam elements addressed   Presentation Evolving: Symptoms, complaints, characteristics of condition changing &/or cognitive deficits present   Clinical Decision Making Moderate complexity   Evaluation Complexity Moderate complexity   Care Plan Goals   PT Rehab Goals Bed Mobility Goal;Gait Training Goal;Transfer Training Goal;Stairs Training Goal   Bed Mobility Goal   Bed Mobility Goal, Date Established 06/07/23   Bed Mobility Goal, Time to Achieve by discharge   Bed Mobility Goal, Activity Type all bed mobility activities   Bed Mobility Goal, Current Status stand-by assistance   Bed Mobility Goal, Independence Level independent   Gait Training  Goal, Distance to Achieve   Gait Training  Goal, Date Established 06/07/23   Gait Training  Goal, Time to Achieve by discharge   Gait Training  Goal, Independence Level independent   Gait  Training  Goal, Distance to Achieve 250 ft   Stairs Training Goal   Stairs Training Goal, Date Established 06/07/23   Stairs Training Goal, Time to Achieve by discharge   Stairs Training Goal, Independence Level independent   Stairs Training Goal, Current Status stand-by assistance   Stairs Training Goal, Number of Stairs to Achieve 14   Transfer Training Goal   Transfer Training Goal, Date Established 06/07/23   Transfer Training Goal, Time to Achieve by discharge   Transfer Training Goal, Activity Type bed-to-chair/chair-to-bed;sit-to-stand/stand-to-sit   Transfer Training Goal, Current Status stand-by assistance   Transfer Training Goal, Independence Level independent   Planned Therapy Interventions, PT Eval   Planned Therapy Interventions (PT) balance training;home exercise program;gait training;bed mobility training;motor coordination training;neuromuscular re-education;patient/family education;postural re-education;ROM (range of motion);stair training;strengthening;stretching;transfer training   Psychosocial Support   Trust Relationship/Rapport care explained       Therapist:   Willaim Bane, PT 08:36 06/07/23  Pager #: [phone]

## 2023-06-07 NOTE — Nurses Notes (Signed)
Patient discharged home with family.  AVS reviewed with patient/care giver.  A written copy of the AVS and discharge instructions was given to the patient/care giver.  Questions sufficiently answered as needed.  Patient/care giver encouraged to follow up with PCP as indicated.  In the event of an emergency, patient/care giver instructed to call 911 or go to the nearest emergency room.      IV cannula removed, all belongings with pt, left with family via personal vehicle, prescriptions given to pt per pharmacy  De Hollingshead, RN

## 2023-06-10 DIAGNOSIS — C61 Malignant neoplasm of prostate: Principal | ICD-10-CM

## 2023-06-10 LAB — SURGICAL PATHOLOGY SPECIMEN
ADDITIONAL PROSTATE DIMENSION IN CENTIMETERS (CM): 3.5
ADDITIONAL PROSTATE DIMENSION IN CENTIMETERS (CM): 3.7
MARGIN STATUS: NEGATIVE
PROSTATE SIZE IN CENTIMETERS (CM): 5.5
PROSTATE WEIGHT IN GRAMS (G): 53

## 2023-06-17 NOTE — Cancer Center Note (Signed)
[facility]   Urologic Oncology Surgery    History & Physical       NAME :Christian Williamson  AGE: 57 y.o.  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 male who presents today for a postoperative visit after undergoing radical prostatectomy.  Patient reports feeling well since surgery.  He has returned to a regular diet and is having normal bowel movements.  His incisions are healing well with minimal pain.  He reports the Foley catheter is bothersome to him but otherwise has no complaints.      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 No issue as adult.    History of kidney disease     stones    HTN (hypertension)     controlled with medication    Hyperlipidemia     recently started on crestor    PTSD (post-traumatic stress disorder)     Sleep apnea     Type 2 diabetes mellitus (CMS HCC)     Wears glasses         Past Surgical History  Past Surgical History:   Procedure Laterality Date    KNEE SURGERY Bilateral     PROSTATE BIOPSY      TESTICLE SURGERY          Medications    Current Outpatient Medications:     albuterol sulfate (PROVENTIL OR VENTOLIN OR PROAIR) 90 mcg/actuation Inhalation oral inhaler, Take 2 Puffs by inhalation Every 6 hours as needed, Disp: , Rfl:     busPIRone (BUSPAR) 10 mg Oral Tablet, Take 1 Tablet (10 mg total) by mouth Three times a day, Disp: , Rfl:     cholecalciferol, vitamin D3, 50 mcg (2,000 unit) Oral Tablet, Take 5 Tablets (10,000 Units total) by mouth Once a day, Disp:  , Rfl:     cyanocobalamin (VITAMIN B 12) 1,000 mcg Oral Tablet, Take 1 Tablet (1,000 mcg total) by mouth Once a day, Disp: , Rfl:     DULoxetine (CYMBALTA DR) 60 mg Oral Capsule, Delayed Release(E.C.), Take 1 Capsule (60 mg total) by mouth Twice daily, Disp: , Rfl:     empagliflozin (JARDIANCE) 25 mg Oral Tablet, Take 1 Tablet (25 mg total) by mouth Once a day Takes in am, Disp: , Rfl:     gabapentin (NEURONTIN) 400 mg Oral Capsule, Take 1 Capsule (400 mg total) by mouth Twice daily, Disp: , Rfl:     gemfibrozil (LOPID) 600 mg Oral Tablet, Take 1 Tablet (600 mg total) by mouth Twice a day before meals, Disp: , Rfl:     glipiZIDE (GLUCOTROL) 5 mg Oral Tablet, Take 1 Tablet (5 mg total) by mouth Every morning before breakfast, Disp: , Rfl:     hydrochlorothiazide (MICROZIDE) 12.5 mg Oral Capsule, Take 1 Capsule (12.5 mg total) by  mouth Every evening, Disp: , Rfl:     HYDROcodone-acetaminophen (NORCO) 5-325 mg Oral Tablet, Take 1 Tablet by mouth Every 6 hours as needed for Pain, Disp: 4 Tablet, Rfl: 0    ketorolac tromethamine (TORADOL) 10 mg Oral Tablet, Take 1 Tablet (10 mg total) by mouth Every 6 hours as needed for Pain, Disp: 6 Tablet, Rfl: 0    lisinopriL-hydrochlorothiazide (ZESTORETIC) 20-25 mg Oral Tablet, Take 1 Tablet by mouth Once a day Takes in am, Disp: , Rfl:     MetFORMIN (GLUCOPHAGE) 1,000 mg Oral Tablet, Take 1 Tablet (1,000 mg total) by mouth Twice daily, Disp: , Rfl:     metoprolol succinate (TOPROL-XL) 100 mg Oral Tablet Sustained Release 24 hr, Take 1 Tablet (100 mg total) by mouth Once a day Takes in am, Disp: , Rfl:     mirtazapine (REMERON) 15 mg Oral Tablet, 1 Tablet (15 mg total) Every night, Disp: , Rfl:     prazosin (MINIPRESS) 1 mg Oral Capsule, 1 Capsule (1 mg total) Every evening, Disp: , Rfl:     rosuvastatin (CRESTOR) 20 mg Oral Tablet, Take 1 Tablet (20 mg total) by mouth Once a day Takes in am, Disp: , Rfl:     Venlafaxine (EFFEXOR) 100 mg Oral Tablet, Take 1 Tablet (100 mg total) by  mouth Three times a day, Disp: , Rfl:      Review of Systems:  Review of Systems   Constitutional: Negative.    HENT: Negative.     Eyes: Negative.    Respiratory: Negative.     Cardiovascular: Negative.    Gastrointestinal: Negative.    Genitourinary: Negative.    Musculoskeletal: Negative.    Skin: Negative.    Neurological: Negative.    Psychiatric/Behavioral: Negative.                  Vital Signs:   BP (!) 142/89 Comment: APRN notified  Pulse 86   Temp 36.7 C (98 F) (Thermal Scan)   Resp 18   Ht 1.651 m (5\' 5" )   Wt 75.8 kg (167 lb 1.6 oz)   SpO2 98%   BMI 27.81 kg/m       Objective:  Physical Exam  Constitutional:       Appearance: Normal appearance.   HENT:      Head: Normocephalic.   Pulmonary:      Effort: Pulmonary effort is normal.   Abdominal:      Palpations: Abdomen is soft.      Comments: Surgical incisions are healing well, no evidence of redness, drainage, or swelling at the surgical sites.    Musculoskeletal:         General: Normal range of motion.   Skin:     General: Skin is warm and dry.   Neurological:      General: No focal deficit present.      Mental Status: He is alert. Mental status is at baseline.   Psychiatric:         Mood and Affect: Mood normal.         Behavior: Behavior normal.          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  A copy of the pathology report was given to the patient.    Remove Foley catheter, see LPN note.  Continue Kegel  exercises to promote urinary continence. I offered pelvic floor PT, he declined but may consider this in the future.   Offered Cialis for penile rehabilitation, patient reports a history of chest pain. He denies any recent episodes of chest pain and has never required cardiac intervention. I recommended he see a cardiologist for clearance before beginning this  medication.   Follow-up in 6 weeks with PSA and telephone visit to review.       Wymon Swaney L. Maurine Cane, MSN, APRN   [facility]   Urologic Oncology Surgery   Phone [phone]  Pager [facility]

## 2023-06-18 ENCOUNTER — Encounter (HOSPITAL_BASED_OUTPATIENT_CLINIC_OR_DEPARTMENT_OTHER): Payer: Self-pay | Admitting: Family

## 2023-06-18 ENCOUNTER — Ambulatory Visit: Payer: 59 | Attending: Family | Admitting: Family

## 2023-06-18 ENCOUNTER — Other Ambulatory Visit: Payer: Self-pay

## 2023-06-18 DIAGNOSIS — C61 Malignant neoplasm of prostate: Secondary | ICD-10-CM | POA: Insufficient documentation

## 2023-06-18 DIAGNOSIS — Z9079 Acquired absence of other genital organ(s): Secondary | ICD-10-CM | POA: Insufficient documentation

## 2023-06-18 DIAGNOSIS — Z79899 Other long term (current) drug therapy: Secondary | ICD-10-CM | POA: Insufficient documentation

## 2023-06-18 DIAGNOSIS — Z483 Aftercare following surgery for neoplasm: Secondary | ICD-10-CM | POA: Insufficient documentation

## 2023-06-18 DIAGNOSIS — Z9889 Other specified postprocedural states: Secondary | ICD-10-CM | POA: Insufficient documentation

## 2023-06-18 NOTE — Nursing Note (Signed)
1240-  Foley Catheter D/c'd per A Griedel APRN order.  9mL water removed from foley balloon and foley catheter removed.  Catheter tip appears intact.  Pt ambulating to exit without any concerns or questions at this time. Yvetta Coder, LPN

## 2023-07-02 ENCOUNTER — Encounter (INDEPENDENT_AMBULATORY_CARE_PROVIDER_SITE_OTHER): Payer: Self-pay | Admitting: UROLOGY

## 2023-07-08 IMAGING — MR MRI SHOULDER LT W/O CONTRAST
6 series · 38 of 40 positions shown · IV contrast (gadolinium)
Comparison: None available.

﻿EXAM:  [id number]   MRI SHOULDER LT W/O CONTRAST
INDICATION: 57-year-old with chronic left shoulder pain.  Trauma years ago. No history of shoulder surgery.
TECHNIQUE: Multiplanar, multisequential MRI of the left shoulder was performed without gadolinium contrast.

[Series 7: T1 · oblique · left · 3.5mm · 0.33mm/px · 7 of 18 slices shown]
[im 1/18]
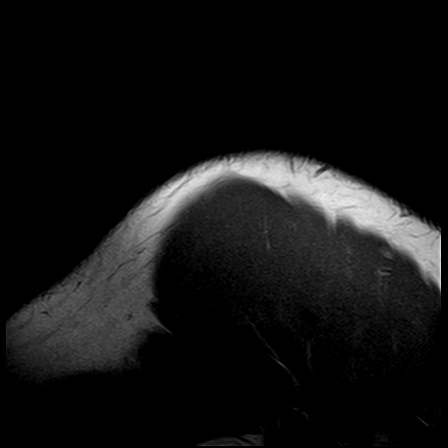
[im 3/18]
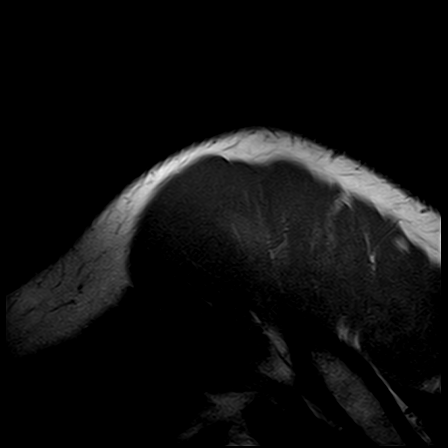
[im 6/18]
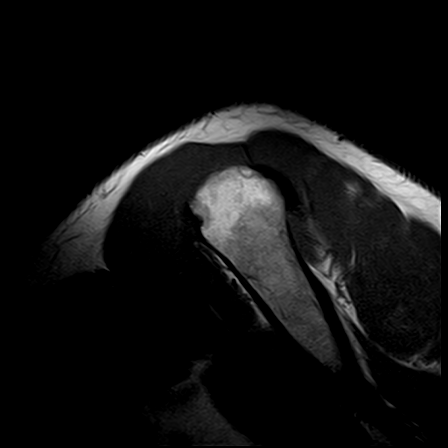
[im 9/18]
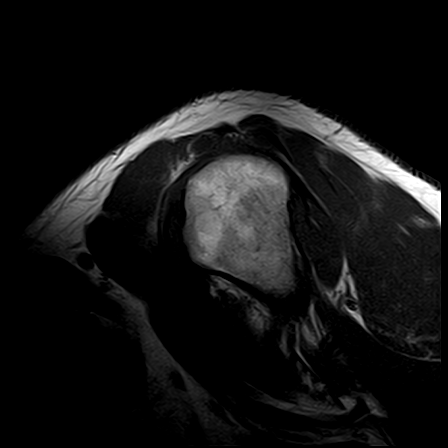
[im 12/18]
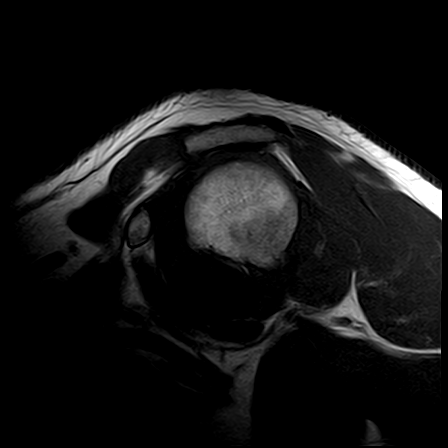
[im 15/18]
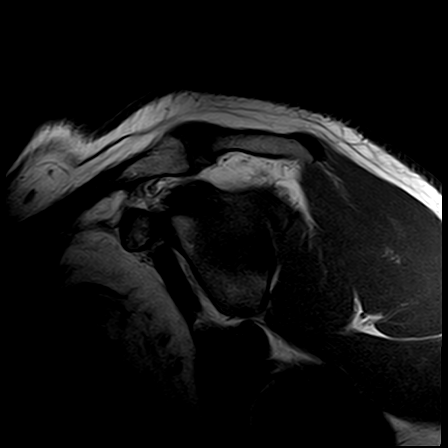
[im 18/18]
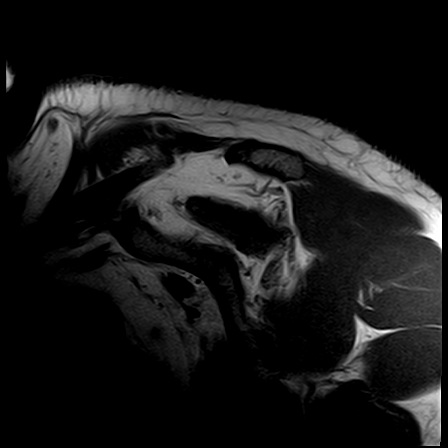

[Series 8: PD fat-sat · axial · left · 4.0mm · 0.50mm/px · z∈[-50,+26]mm · 7 of 18 slices shown (1 of 2)]
[im 1/18]
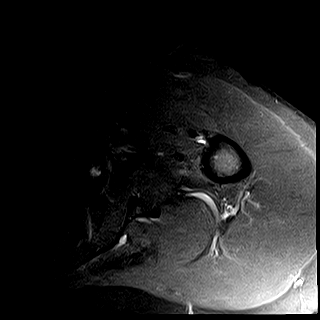
[im 3/18]
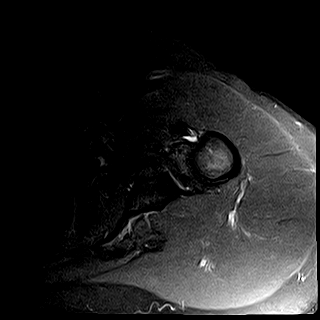
[im 6/18]
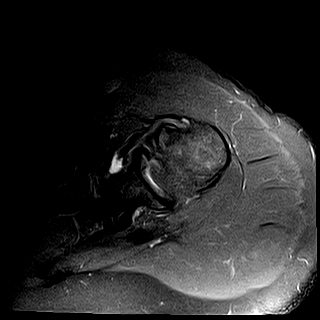
[im 9/18]
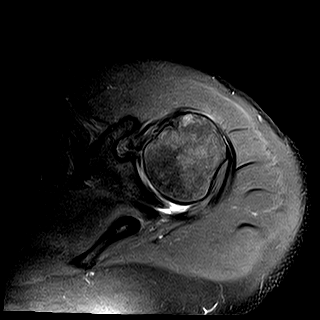
[im 12/18]
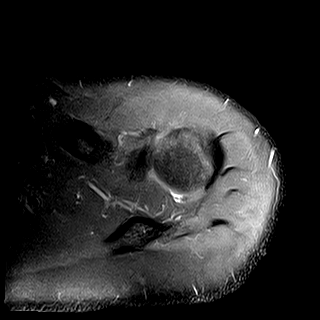
[im 15/18]
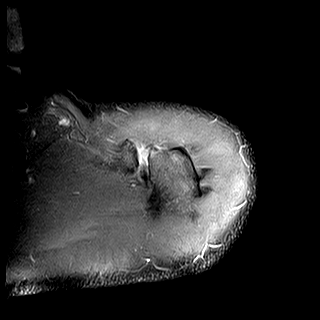
[im 18/18]
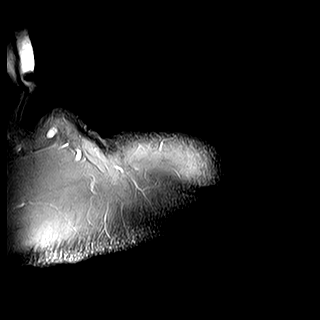

[Series 9: STIR · oblique · left · 3.5mm · 0.47mm/px · 6 of 18 slices shown (1 of 2)]
[im 1/18]
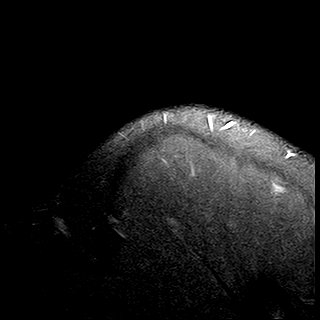
[im 4/18]
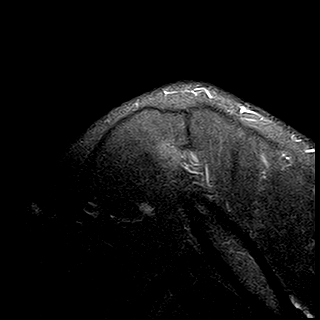
[im 7/18]
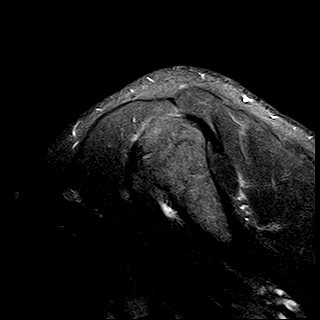
[im 11/18]
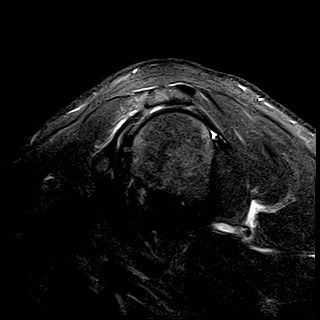
[im 14/18]
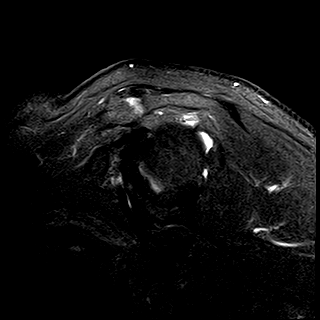
[im 18/18]
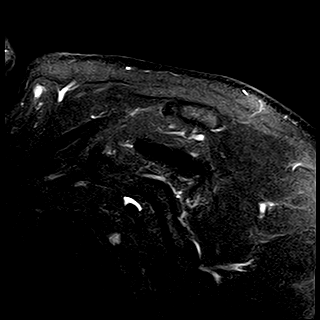

[Series 10: T2 fat-sat · axial · left · 4.0mm · 0.42mm/px · z∈[-64,+39]mm · 8 of 24 slices shown]
[im 1/24]
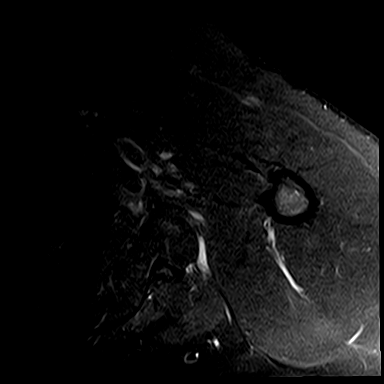
[im 4/24]
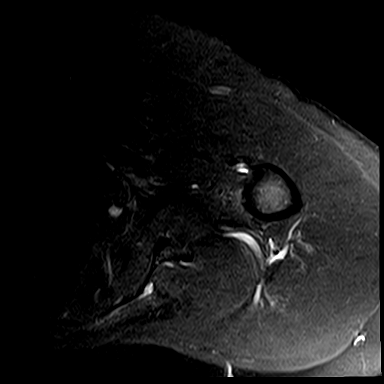
[im 7/24]
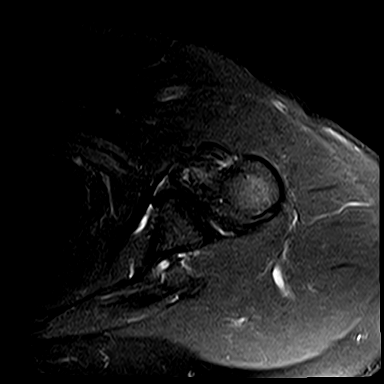
[im 10/24]
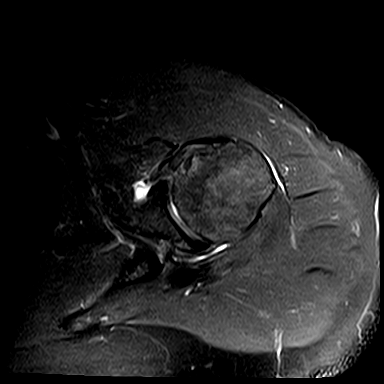
[im 14/24]
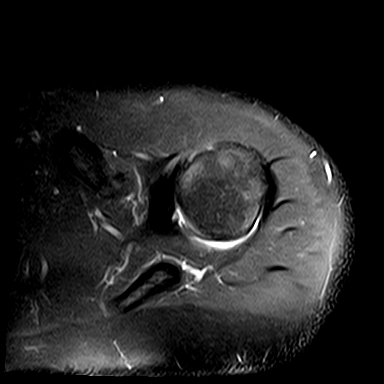
[im 17/24]
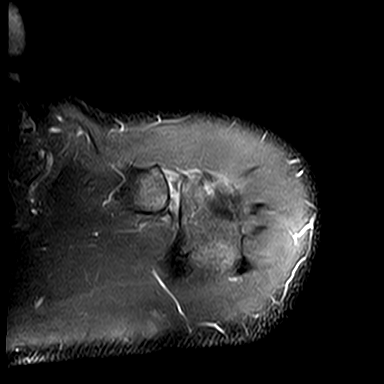
[im 20/24]
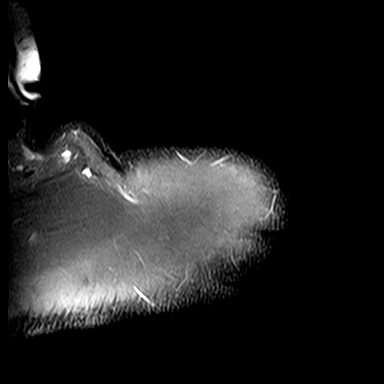
[im 24/24]
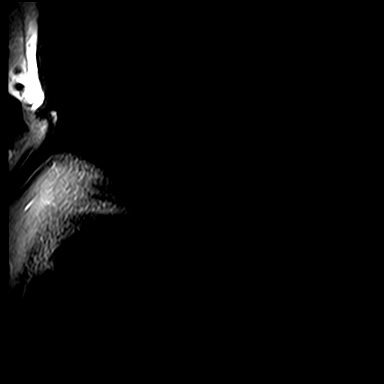

[Series 11: PD fat-sat · oblique · left · 3.5mm · 0.47mm/px · 6 of 18 slices shown (2 of 2)]
[im 1/18]
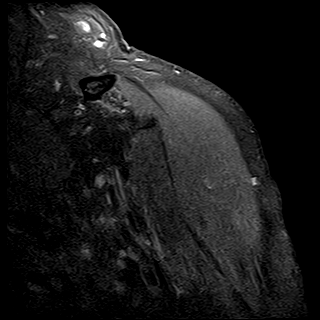
[im 4/18]
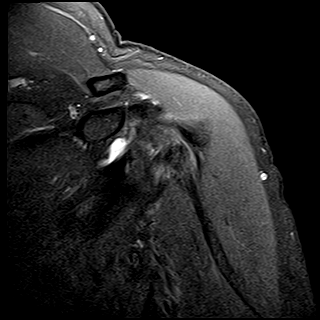
[im 7/18]
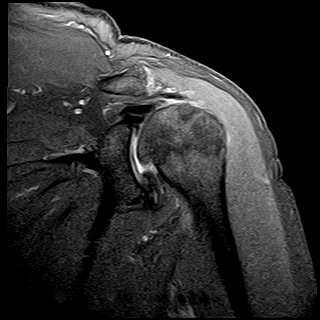
[im 11/18]
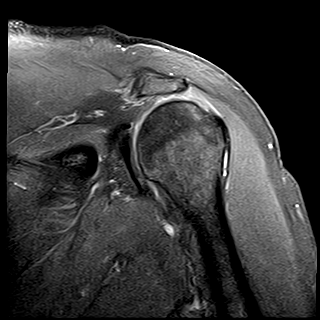
[im 14/18]
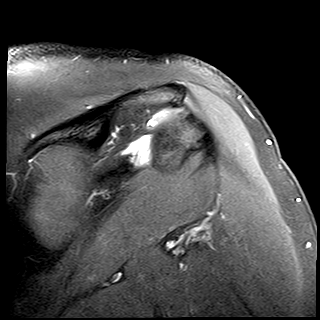
[im 18/18]
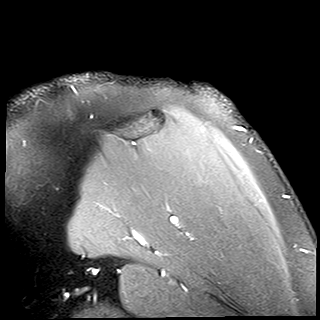

[Series 12: STIR · oblique · left · 3.5mm · 0.47mm/px · 4 of 18 slices shown (2 of 2)]
[im 1/18]
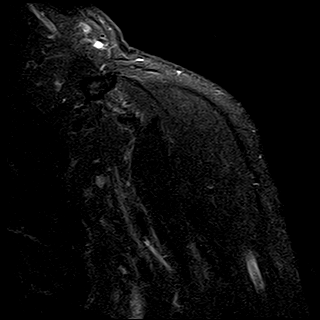
[im 4/18]
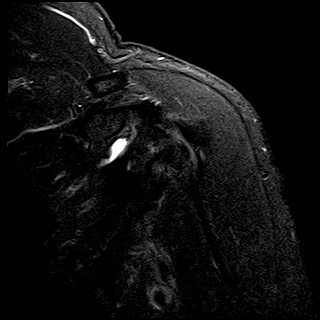
[im 7/18]
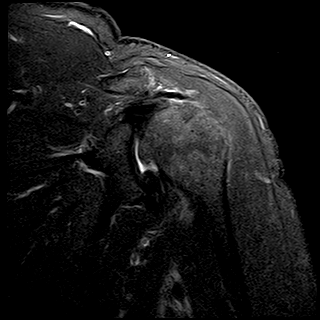
[im 11/18]
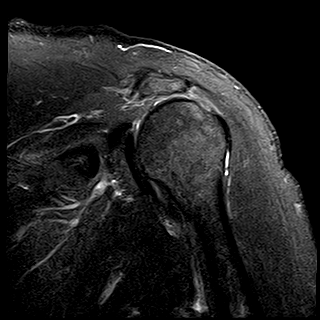

[38 of 40 positions shown; findings below may reference images not displayed]

FINDINGS: No acute bony lesions at the left shoulder.

There is a large, retracted full-thickness, full-width tear of distal supraspinatus tendon, approximately 3 cm in length.  Atrophic changes of supraspinatus muscle are noted.  Superior subluxation of the head of the humerus due to the retracted full-thickness tear. Degenerative changes of AC joint are impinging on the subacromion space and supraspinatus to a moderate degree.

No evidence of glenoid labral tear.  Long head of biceps tendon is visualized.
IMPRESSION: 1. No acute bony lesions at the left shoulder. 

2. Large, retracted full-thickness, full-width tear of distal supraspinatus tendon with mild superior subluxation of the head of the humerus at the glenohumeral joint. 

3. Mild atrophic changes of supraspinatus muscle.  

4. Degenerative arthritis of AC joint mildly impinging on subacromion space and the supraspinatus.

## 2023-07-08 IMAGING — MR MRI LUMBAR SPINE WITHOUT CONTRAST
4 of 6 series · 29 of 48 positions shown · IV contrast (gadolinium)
Comparison: None available.

﻿EXAM:  [id number]   MRI LUMBAR SPINE WITHOUT CONTRAST
INDICATION: 57-year-old male with chronic low back pain.  Sustained trauma due to fall several years ago.  Pain, both hips and both legs.  No prior malignancy or back surgery.
TECHNIQUE: Multiplanar, multisequential MRI of the lumbosacral spine was performed without gadolinium contrast.

[Series 5: T2 · sagittal · 4.0mm · 0.94mm/px · 6 of 13 slices shown (1 of 3)]
[im 1/13]
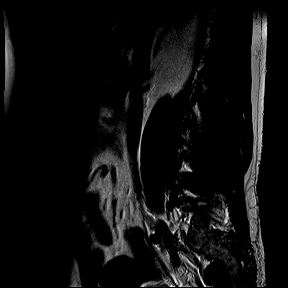
[im 3/13]
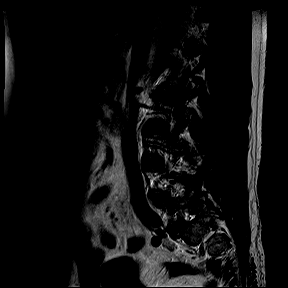
[im 5/13]
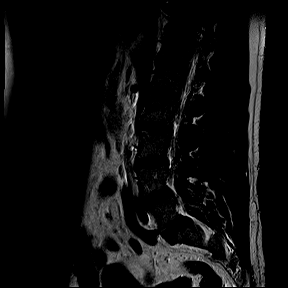
[im 8/13]
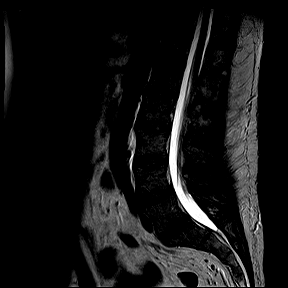
[im 10/13]
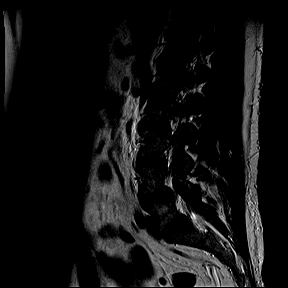
[im 13/13]
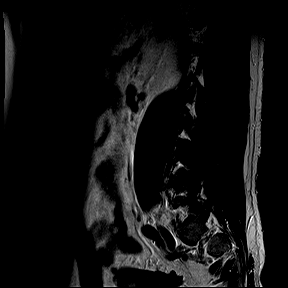

[Series 6: T1 · sagittal · 4.0mm · 0.94mm/px · 3 of 13 slices shown]
[im 3/13]
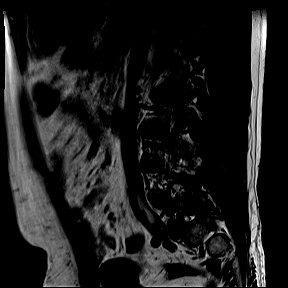
[im 8/13]
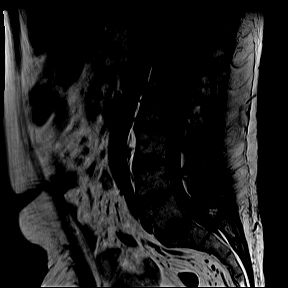
[im 13/13]
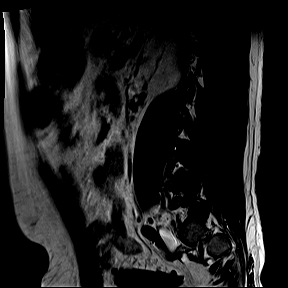

[Series 8: T2 · coronal · 5.0mm · 0.82mm/px · 9 of 18 slices shown (2 of 3)]
[im 1/18]
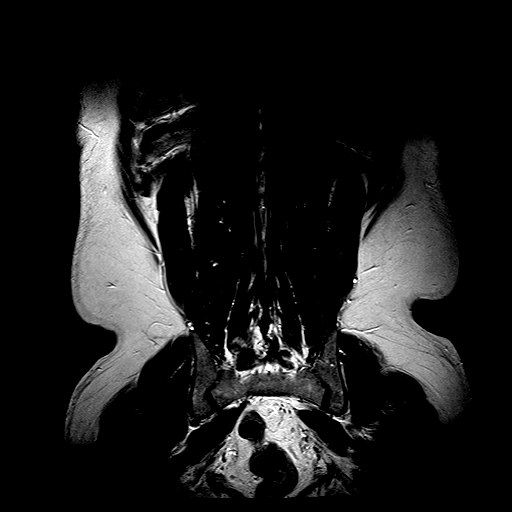
[im 3/18]
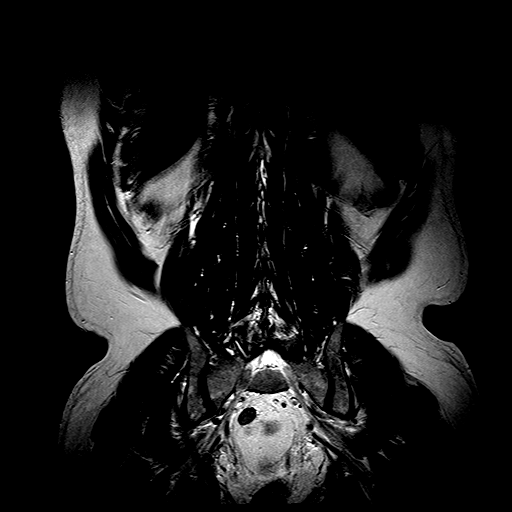
[im 5/18]
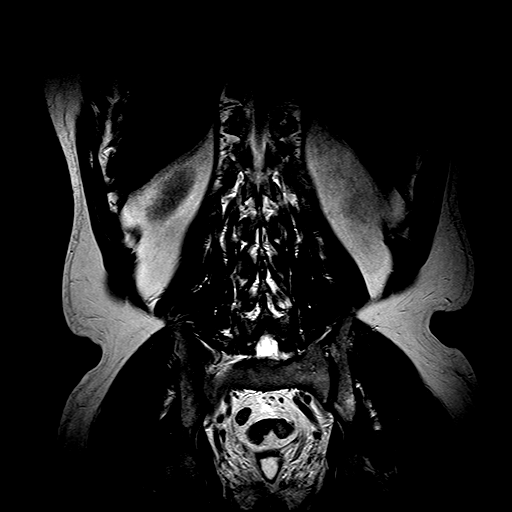
[im 7/18]
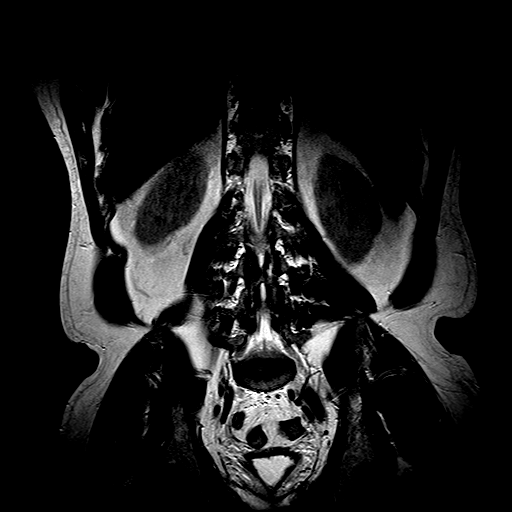
[im 9/18]
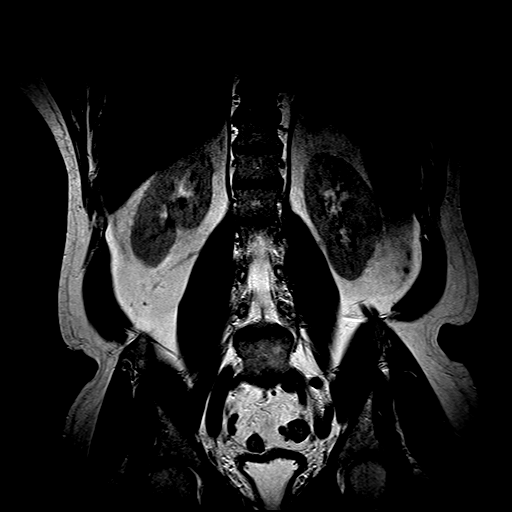
[im 11/18]
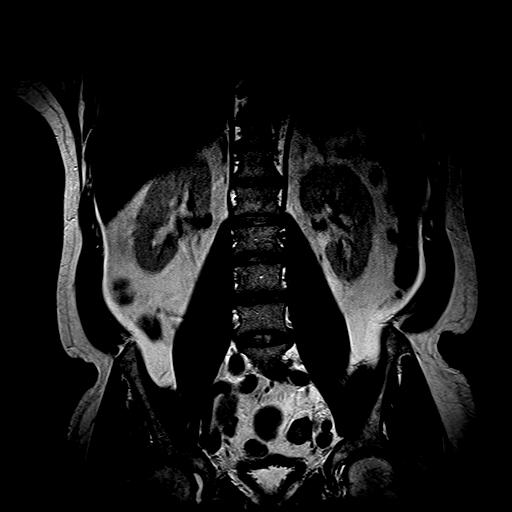
[im 13/18]
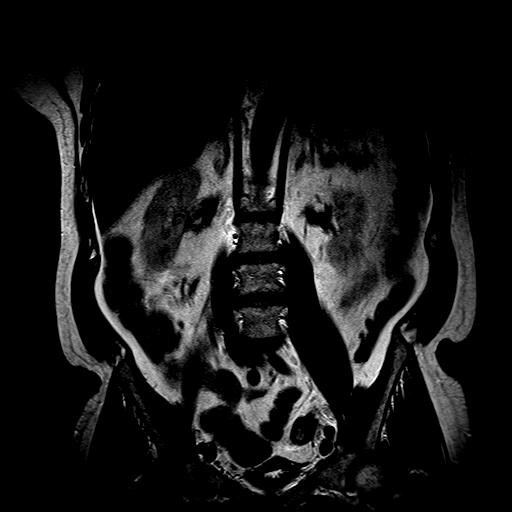
[im 15/18]
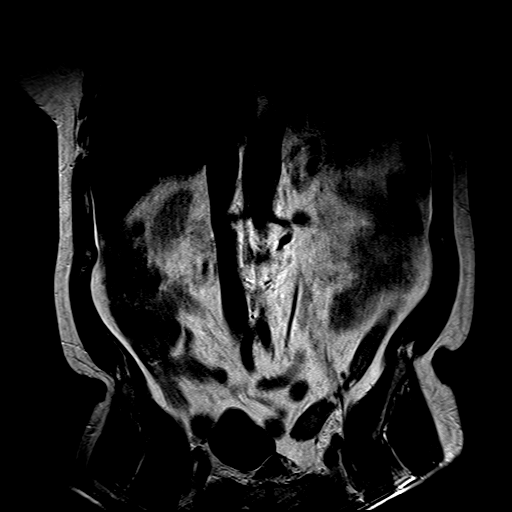
[im 18/18]
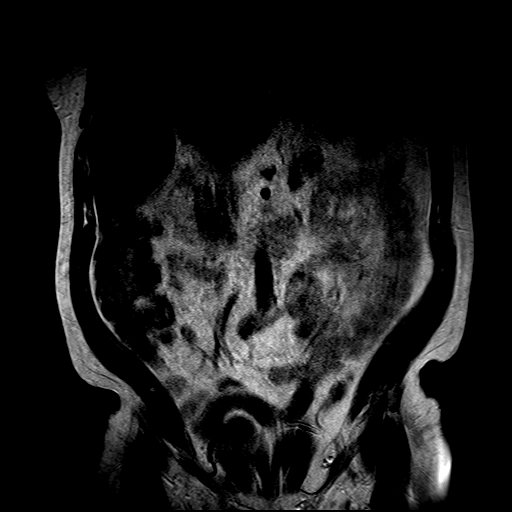

[Series 9: T2 · axial · 4.0mm · 0.52mm/px · z∈[-90,+149]mm · 11 of 22 slices shown (3 of 3)]
[im 1/22]
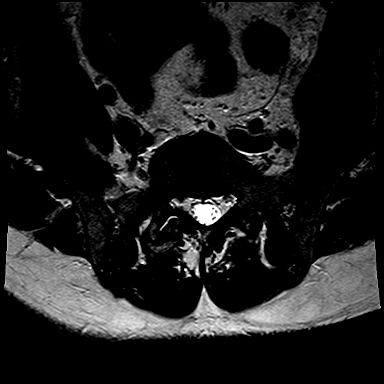
[im 3/22]
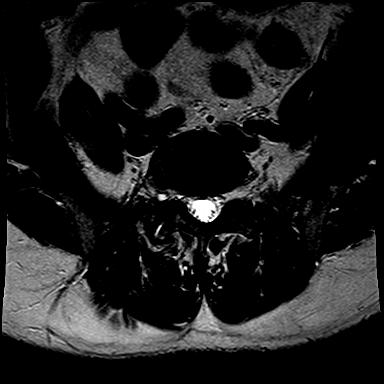
[im 5/22]
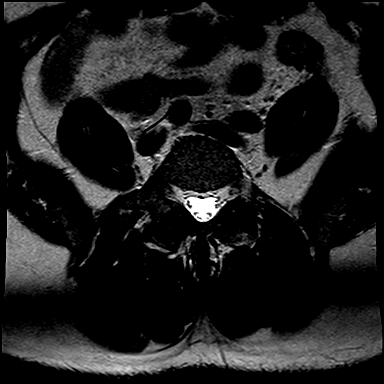
[im 7/22]
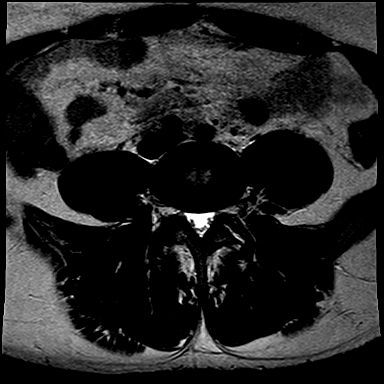
[im 9/22]
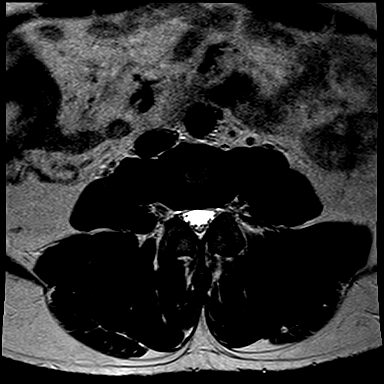
[im 11/22]
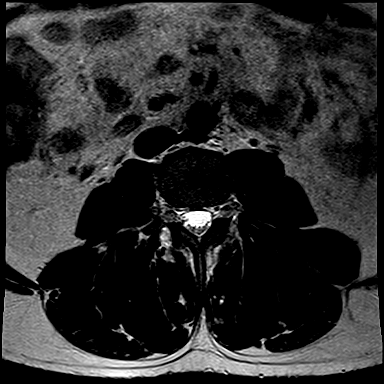
[im 13/22]
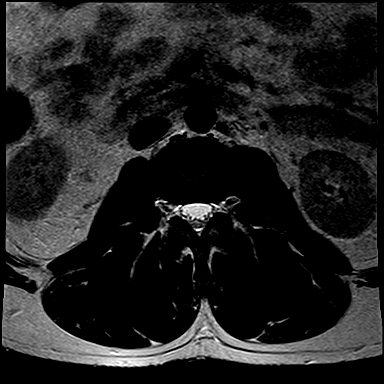
[im 15/22]
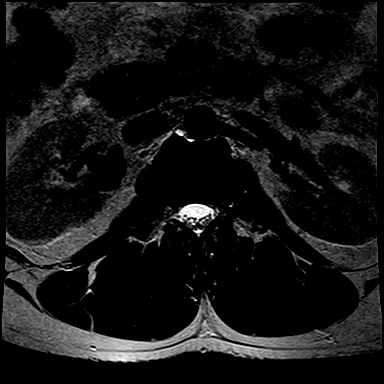
[im 17/22]
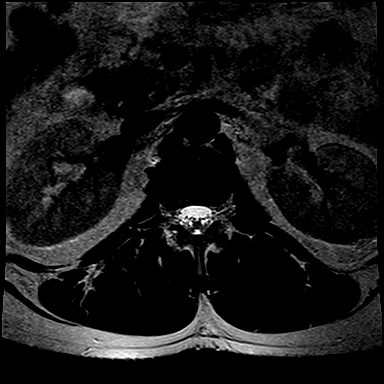
[im 19/22]
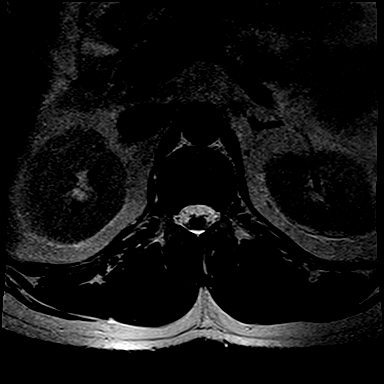
[im 22/22]
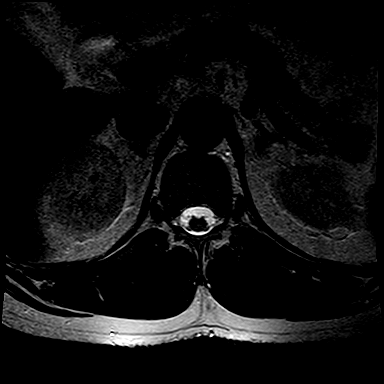

[29 of 48 positions shown; findings below may reference images not displayed]

FINDINGS: No acute or focal bone changes of lumbar vertebrae are seen.  Conus medullaris and cauda equina structures are normal in the sagittal projection. 

At L1-2 level, no focal disc lesions are seen.

At L2-3 level, no focal disc lesions are seen.

At L3-4 level, mild degenerative disc changes and facet arthropathy are causing minimal compromise of both lateral recess. 

At L4-5 level, significant bilateral facet arthropathy is noted, left more prominent than right with hypertrophy of ligaments. Minimal degenerative anterolisthesis of L4 on L5 with mild degenerative disc changes with bulging annulus. Combination causes mild compromise of the thecal sac with AP diameter in the midline measuring 10 mm.  Moderately significant compromise of both lateral recess is noted, left more than the right.

At L5-S1 level, degenerative disc disease and facet arthropathy with asymmetric bulging annulus to the right is noted.  Moderately significant compromise of right lateral recess and neural foramen are noted. 

Paravertebral soft tissues are unremarkable.
IMPRESSION: 1. No acute or focal bone changes of lumbar vertebrae. 

2. At L4-5 level, significant bilateral facet arthropathy is noted, left more prominent than right with hypertrophy of ligaments. Minimal degenerative anterolisthesis of L4 on L5 with mild degenerative disc changes with bulging annulus. Combination causes mild compromise of the thecal sac with AP diameter in the midline measuring 10 mm.  Moderately significant compromise of both lateral recess are noted, left more than the right.

3. At L5-S1 level, degenerative disc disease and facet arthropathy with asymmetric bulging annulus to the right is noted.  Moderately significant compromise of right lateral recess and neural foramen are noted. 

4. Findings at other disc levels are described above in detail.

## 2023-07-26 ENCOUNTER — Ambulatory Visit (INDEPENDENT_AMBULATORY_CARE_PROVIDER_SITE_OTHER): Payer: 59 | Admitting: Student in an Organized Health Care Education/Training Program

## 2023-07-26 ENCOUNTER — Encounter (INDEPENDENT_AMBULATORY_CARE_PROVIDER_SITE_OTHER): Payer: Self-pay | Admitting: Student in an Organized Health Care Education/Training Program

## 2023-07-26 ENCOUNTER — Other Ambulatory Visit: Payer: Self-pay

## 2023-07-26 VITALS — BP 142/79 | HR 69 | Ht 66.0 in | Wt 168.0 lb

## 2023-07-26 DIAGNOSIS — N3289 Other specified disorders of bladder: Secondary | ICD-10-CM

## 2023-07-26 DIAGNOSIS — N393 Stress incontinence (female) (male): Secondary | ICD-10-CM

## 2023-07-26 DIAGNOSIS — C61 Malignant neoplasm of prostate: Secondary | ICD-10-CM

## 2023-07-26 NOTE — Cancer Center Note (Addendum)
UROLOGY ONCOLOGY, [facility]  [address]  [place] [address]  Operated by [facility]  Telephone Visit    Name:  Christian Williamson MRN: [id number]   Date:  07/30/2023 Age:   57 y.o.     The patient/family initiated a request for telephone service.  Verbal consent for this service was obtained from the patient/family.    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 I do not have an updated PSA for him. I did leave a voicemail with our contact information and requested he return our call. He was previously offered Cialis for penile rehabilitation but requires cardiac clearance prior to beginning therapy for history of chest pain. I will place a new order for PSA that he can complete locally.     Plan:   Plan to reschedule telephone visit in 1-2 weeks with PSA.       ICD-10-CM    1. PSA elevation  R97.20 PSA, DIAGNOSTIC          Limmie Patricia, FNP-C

## 2023-07-26 NOTE — Progress Notes (Signed)
UROLOGY, [facility]  [address] [place] [address]    Progress Note    Name: Christian Williamson MRN:  [id number]   Date: 07/26/2023 DOB:  May 15, 1966 (57 y.o.)             Chief Complaint: Follow Up (Follow up from prostatectomy on 05/2023 in [place]. )  Subjective   Subjective:   Christian Williamson is a 57 y.o., White male who was recently found to have intermediate risk prostate cancer on PNBx and underwent a radical prostatectomy on June 06, 2023 with the final pathology demonstrating Gleason 3 + 4 disease. He had negative margins. He has stress incontinence with around 10-12 leaking episodes per day but this is slowly improving. He is doing daily kegels. He reports sporadic intermittent bladder spasms. He states these have also been improving daily. He denies fevers, chills, nausea, vomiting, hematuria, dysuria, flank pain, dribbling, hesitancy, suprapubic pain, headaches, vision changes, shortness of breath, chest pain.          PROSTATE SPECIFIC ANTIGEN   Lab Results   Component Value Date/Time    PROSSPECAG 9.91 (H) 05/01/2023 03:18 PM              Objective   Objective :  BP (!) 142/79 (Site: Right Arm, Patient Position: Sitting, Cuff Size: Adult)   Pulse 69   Ht 1.676 m (5\' 6" )   Wt 76.2 kg (168 lb)   BMI 27.12 kg/m       Gen: NAD, alert  Pulm: unlabored at rest  CV: palpable pulses  Abd: soft, Nt/ND  GU: no suprapubic tenderness, no CVAT    Data reviewed:    Current Outpatient Medications   Medication Sig    albuterol sulfate (PROVENTIL OR VENTOLIN OR PROAIR) 90 mcg/actuation Inhalation oral inhaler Take 2 Puffs by inhalation Every 6 hours as needed    busPIRone (BUSPAR) 10 mg Oral Tablet Take 1 Tablet (10 mg total) by mouth Three times a day    cholecalciferol, vitamin D3, 50 mcg (2,000 unit) Oral Tablet Take 5 Tablets (10,000 Units total) by mouth Once a day    cyanocobalamin (VITAMIN B 12) 1,000 mcg Oral Tablet Take 1 Tablet (1,000 mcg total) by mouth Once a day     DULoxetine (CYMBALTA DR) 60 mg Oral Capsule, Delayed Release(E.C.) Take 1 Capsule (60 mg total) by mouth Twice daily    empagliflozin (JARDIANCE) 25 mg Oral Tablet Take 1 Tablet (25 mg total) by mouth Once a day Takes in am    gabapentin (NEURONTIN) 400 mg Oral Capsule Take 1 Capsule (400 mg total) by mouth Twice daily    gemfibrozil (LOPID) 600 mg Oral Tablet Take 1 Tablet (600 mg total) by mouth Twice a day before meals    glipiZIDE (GLUCOTROL) 5 mg Oral Tablet Take 1 Tablet (5 mg total) by mouth Every morning before breakfast    hydrochlorothiazide (MICROZIDE) 12.5 mg Oral Capsule Take 1 Capsule (12.5 mg total) by mouth Every evening    HYDROcodone-acetaminophen (NORCO) 5-325 mg Oral Tablet Take 1 Tablet by mouth Every 6 hours as needed for Pain    ketorolac tromethamine (TORADOL) 10 mg Oral Tablet Take 1 Tablet (10 mg total) by mouth Every 6 hours as needed for Pain    lisinopriL-hydrochlorothiazide (ZESTORETIC) 20-25 mg Oral Tablet Take 1 Tablet by mouth Once a day Takes in am    MetFORMIN (GLUCOPHAGE) 1,000 mg Oral Tablet Take 1 Tablet (1,000 mg total) by mouth  Twice daily    metoprolol succinate (TOPROL-XL) 100 mg Oral Tablet Sustained Release 24 hr Take 1 Tablet (100 mg total) by mouth Once a day Takes in am    mirtazapine (REMERON) 15 mg Oral Tablet 1 Tablet (15 mg total) Every night    prazosin (MINIPRESS) 1 mg Oral Capsule 1 Capsule (1 mg total) Every evening    rosuvastatin (CRESTOR) 20 mg Oral Tablet Take 1 Tablet (20 mg total) by mouth Once a day Takes in am    Venlafaxine (EFFEXOR) 100 mg Oral Tablet Take 1 Tablet (100 mg total) by mouth Three times a day        Assessment/Plan  Problem List Items Addressed This Visit          Urology    Prostate cancer ([facility]) - Primary         Intermediate favorable risk prostate cancer s/p RALRPx on 06/06/2023  I again reviewed patient's clinical intermediate risk prostate cancer history, recent prostate specific antigen (PSA) and applicable and the need for  continued prostate cancer surveillance as directed by the time from diagnosis or last recurrence  Would recommend patient undergo next prostate specific antigen (PSA) in 1 month and every 3 months for 2 years        Post-RP stress urinary incontinence  Informed patient that this generally resolves after one year. Continue kegels.       Bladder spasms  Informed patient likely related to post-procedural healing. Will continue to monitor.       [name], DO     A combined total of 30 minutes were spent preparing to see the patient, reviewing previous records, ordering tests/medications/procedures, documenting the clinical encounter as well as performing a medically appropriate evaluation and independently interpreting results and communicating them to the patient/family/caregiver as specifically outlined above in the impression and plan.    This note may have been fully or partially generated using MModal Fluency Direct system, and there may be some incorrect words, spellings, and punctuation that were not identified in checking the note before saving.

## 2023-07-29 ENCOUNTER — Ambulatory Visit (HOSPITAL_BASED_OUTPATIENT_CLINIC_OR_DEPARTMENT_OTHER): Payer: Self-pay | Admitting: Urology

## 2023-07-29 ENCOUNTER — Other Ambulatory Visit (HOSPITAL_BASED_OUTPATIENT_CLINIC_OR_DEPARTMENT_OTHER): Payer: Self-pay | Admitting: Urology

## 2023-07-29 NOTE — Nursing Note (Signed)
Message from [place] K sent at 07/29/2023  1:25 PM EDT    Hajiran pt    Pt states he needs an excuse for every appt he has had. Pt started being seen in May. Pt states they will not pay his mileage without them. Could you please call him back to discuss?    Thank you       Call History    Contact Date/Time Type Contact Phone/Fax User   07/29/2023 01:24 PM EDT Phone (Incoming) [place], Nikkolai Hartl (Self) [phone] Rexene Edison) Jason Fila       RN f/u with pt.  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 Pt requesting an excuse for clinic visits/surgery. Pt is requesting this be sent via mail. RN instructed pt this may take longer. Pt reports he does not need emergently. RN placing into department mail.      , CLINICAL CARE COORDINATOR

## 2023-07-30 ENCOUNTER — Ambulatory Visit: Payer: 59 | Attending: Family | Admitting: Family

## 2023-07-30 DIAGNOSIS — R972 Elevated prostate specific antigen [PSA]: Secondary | ICD-10-CM

## 2023-08-13 NOTE — Cancer Center Note (Signed)
UROLOGY ONCOLOGY, [facility]  [address]  [place] [address]  Operated by [facility]  Telephone Visit    Name:  Christian Williamson MRN: [id number]   Date:  08/16/2023 Age:   57 y.o.     The patient/family initiated a request for telephone service.  Verbal consent for this service was obtained from the patient/family.    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 I did leave a voicemail for him to return my call.      Plan:   Plan to reschedule telephone visit in 1-2 weeks with PSA.       Limmie Patricia, FNP-C

## 2023-08-16 ENCOUNTER — Ambulatory Visit: Payer: 59 | Attending: Urology | Admitting: Family

## 2023-08-16 DIAGNOSIS — C61 Malignant neoplasm of prostate: Secondary | ICD-10-CM

## 2023-08-17 ENCOUNTER — Other Ambulatory Visit: Payer: Self-pay

## 2023-08-29 ENCOUNTER — Other Ambulatory Visit (INDEPENDENT_AMBULATORY_CARE_PROVIDER_SITE_OTHER): Payer: Self-pay | Admitting: Student in an Organized Health Care Education/Training Program

## 2023-08-29 DIAGNOSIS — C61 Malignant neoplasm of prostate: Secondary | ICD-10-CM

## 2023-08-29 DIAGNOSIS — R972 Elevated prostate specific antigen [PSA]: Secondary | ICD-10-CM

## 2023-09-02 NOTE — Cancer Center Note (Signed)
UROLOGY ONCOLOGY, [facility]  [address]  [place] [address]  Operated by [facility]  Telephone Visit    Name:  Christian Williamson MRN: [id number]   Date:  09/03/2023 Age:   57 y.o.     The patient/family initiated a request for telephone service.  Verbal consent for this service was obtained from the patient/family.    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 I did leave a voicemail for him to return my call.      Plan:   Plan to reschedule telephone visit in 1-2 weeks with PSA.       Limmie Patricia, FNP-C

## 2023-09-03 ENCOUNTER — Ambulatory Visit: Payer: 59 | Attending: Urology | Admitting: Family

## 2023-09-03 ENCOUNTER — Other Ambulatory Visit: Payer: Self-pay

## 2023-09-03 DIAGNOSIS — C61 Malignant neoplasm of prostate: Secondary | ICD-10-CM

## 2023-09-16 NOTE — Cancer Center Note (Signed)
UROLOGY ONCOLOGY, [facility]  [address]  [place] [address]  Operated by [facility]  Telephone Visit    Name:  Jabre Heo MRN: [id number]   Date:  09/17/2023 Age:   57 y.o.     The patient/family initiated a request for telephone service.  Verbal consent for this service was obtained from the patient/family.    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 The number listed in the chart is disconnected.     Plan:   I will request an appt letter be sent to this patient.       Limmie Patricia, FNP-C

## 2023-09-17 ENCOUNTER — Ambulatory Visit: Payer: 59 | Attending: Family | Admitting: Family

## 2023-09-17 ENCOUNTER — Other Ambulatory Visit: Payer: Self-pay

## 2023-09-17 DIAGNOSIS — C61 Malignant neoplasm of prostate: Secondary | ICD-10-CM

## 2023-11-26 ENCOUNTER — Other Ambulatory Visit: Payer: Self-pay

## 2023-11-26 ENCOUNTER — Encounter (INDEPENDENT_AMBULATORY_CARE_PROVIDER_SITE_OTHER): Payer: Self-pay | Admitting: Student in an Organized Health Care Education/Training Program

## 2023-11-26 ENCOUNTER — Ambulatory Visit (INDEPENDENT_AMBULATORY_CARE_PROVIDER_SITE_OTHER): Payer: 59 | Admitting: Student in an Organized Health Care Education/Training Program

## 2023-11-26 VITALS — BP 132/75 | HR 68 | Ht 66.0 in | Wt 172.0 lb

## 2023-11-26 DIAGNOSIS — C61 Malignant neoplasm of prostate: Secondary | ICD-10-CM

## 2023-11-26 DIAGNOSIS — N393 Stress incontinence (female) (male): Secondary | ICD-10-CM

## 2023-11-26 DIAGNOSIS — Z9079 Acquired absence of other genital organ(s): Secondary | ICD-10-CM

## 2023-11-26 DIAGNOSIS — N3289 Other specified disorders of bladder: Secondary | ICD-10-CM

## 2023-11-26 NOTE — Progress Notes (Signed)
UROLOGY, [facility]  [address] [place] [address]    Progress Note    Name: Christian Williamson MRN:  [id number]   Date: 11/26/2023 DOB:  04-Mar-1966 (57 y.o.)             Chief Complaint: No chief complaint on file.  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 He had negative margins. He has stress incontinence with around 10-12 leaking episodes per day but this is slowly improving. He is doing daily kegels. He reports sporadic intermittent bladder spasms. He states these have also been improving daily.     He presents today follow up. His recent PSA is undetectable. He continues to have stress urinary incontinence however it continues to improve. He denies complaints. He denies fevers, chills, nausea, vomiting, hematuria, dysuria, flank pain, dribbling, hesitancy, suprapubic pain, headaches, vision changes, shortness of breath, chest pain.       10/22/2023: PSA <0.01    PROSTATE SPECIFIC ANTIGEN   Lab Results   Component Value Date/Time    PROSSPECAG 9.91 (H) 05/01/2023 03:18 PM                Objective   Objective :  There were no vitals taken for this visit.      Gen: NAD, alert  Pulm: unlabored at rest  CV: palpable pulses  Abd: soft, Nt/ND  GU: no suprapubic tenderness, no CVAT      Data reviewed:    Current Outpatient Medications   Medication Sig    albuterol sulfate (PROVENTIL OR VENTOLIN OR PROAIR) 90 mcg/actuation Inhalation oral inhaler Take 2 Puffs by inhalation Every 6 hours as needed    busPIRone (BUSPAR) 10 mg Oral Tablet Take 1 Tablet (10 mg total) by mouth Three times a day    cholecalciferol, vitamin D3, 50 mcg (2,000 unit) Oral Tablet Take 5 Tablets (10,000 Units total) by mouth Once a day    cyanocobalamin (VITAMIN B 12) 1,000 mcg Oral Tablet Take 1 Tablet  (1,000 mcg total) by mouth Once a day    DULoxetine (CYMBALTA DR) 60 mg Oral Capsule, Delayed Release(E.C.) Take 1 Capsule (60 mg total) by mouth Twice daily    empagliflozin (JARDIANCE) 25 mg Oral Tablet Take 1 Tablet (25 mg total) by mouth Once a day Takes in am    gabapentin (NEURONTIN) 400 mg Oral Capsule Take 1 Capsule (400 mg total) by mouth Twice daily    gemfibrozil (LOPID) 600 mg Oral Tablet Take 1 Tablet (600 mg total) by mouth Twice a day before meals    glipiZIDE (GLUCOTROL) 5 mg Oral Tablet Take 1 Tablet (5 mg total) by mouth Every morning before breakfast    hydrochlorothiazide (MICROZIDE) 12.5 mg Oral Capsule Take 1 Capsule (12.5 mg total) by mouth Every evening    HYDROcodone-acetaminophen (NORCO) 5-325 mg Oral Tablet Take 1 Tablet by mouth Every 6 hours as needed for Pain    ketorolac tromethamine (TORADOL) 10 mg Oral Tablet Take 1 Tablet (10 mg total) by mouth Every 6 hours as needed for Pain    lisinopriL-hydrochlorothiazide (ZESTORETIC) 20-25 mg Oral Tablet Take 1 Tablet by mouth Once a day Takes in am    MetFORMIN (GLUCOPHAGE) 1,000 mg Oral Tablet Take 1 Tablet (1,000 mg total)  by mouth Twice daily    metoprolol succinate (TOPROL-XL) 100 mg Oral Tablet Sustained Release 24 hr Take 1 Tablet (100 mg total) by mouth Once a day Takes in am    mirtazapine (REMERON) 15 mg Oral Tablet 1 Tablet (15 mg total) Every night    prazosin (MINIPRESS) 1 mg Oral Capsule 1 Capsule (1 mg total) Every evening    rosuvastatin (CRESTOR) 20 mg Oral Tablet Take 1 Tablet (20 mg total) by mouth Once a day Takes in am    Venlafaxine (EFFEXOR) 100 mg Oral Tablet Take 1 Tablet (100 mg total) by mouth Three times a day        Assessment/Plan  Problem List Items Addressed This Visit    None    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 Continue kegels.         Bladder spasms  Informed patient likely related to post-procedural healing. Will continue to monitor.         [name], DO     A combined total of 20 minutes were spent preparing to see the patient, reviewing previous records, ordering tests/medications/procedures, documenting the clinical encounter as well as performing a medically appropriate evaluation and independently interpreting results and communicating them to the patient/family/caregiver as specifically outlined above in the impression and plan.    This note may have been fully or partially generated using MModal Fluency Direct system, and there may be some incorrect words, spellings, and punctuation that were not identified in checking the note before saving.

## 2024-03-18 ENCOUNTER — Other Ambulatory Visit: Payer: Self-pay

## 2024-03-18 ENCOUNTER — Encounter (INDEPENDENT_AMBULATORY_CARE_PROVIDER_SITE_OTHER): Payer: Self-pay | Admitting: NEUROLOGY

## 2024-03-27 ENCOUNTER — Ambulatory Visit (INDEPENDENT_AMBULATORY_CARE_PROVIDER_SITE_OTHER): Admitting: NEUROLOGY

## 2024-03-27 ENCOUNTER — Other Ambulatory Visit: Payer: Self-pay

## 2024-03-27 DIAGNOSIS — M79602 Pain in left arm: Secondary | ICD-10-CM

## 2024-03-27 NOTE — Procedures (Signed)
 NEUROLOGY, [facility]  [address]  [place] [address]    Procedure Note    Name: Auther Lyerly MRN:  [id number]   Date: 03/27/2024 DOB:  22-Mar-1966 (58 y.o.)         [address] - NEEDLE ELECTROMYOGRAPHY; 1 EXTREMITY W/ OR W/O RELATED PARASPINAL AREAS (AMB ONLY)    Date/Time: 03/27/2024 9:20 AM    Performed by: Kathe Pallas, DO  Authorized by: Kathe Pallas, DO        ELECTROMYOGRAPHY REPORT    DATE OF SERVICE:  03/27/2024      PERFORMING PHYSICIAN:  Laymon Priest, D.O.      REFERRING PHYSICIAN:  Kelleen Patee, D.O.    HISTORY:  He is a 58 year old man who is referred for left shoulder pain and numbness. This occurred following a rotator cuff injury that required surgical corrections.     EXAMINATION:  Strength is intact throughout the upper extremities (internal and external rotation were deferred). Deep tendon reflexes are 2+ throughout. Pin sensation is intact throughout.    NERVE CONDUCTION STUDIES:  1.Normal left ulnar-D5 and radial-snuff box sensory nerve action potentials (SNAPs).  2.Normal left median-D2 sensory amplitudes with moderately slowed conduction velocities.   3.Normal left ulnar-ADM compound muscle action potentials (CMAPs).  4.Normal left median-APB motor amplitudes with borderline conduction velocities and moderately prolonged distal motor latencies.     ELECTROMYOGRAPHY:  Normal studies for the left deltoid, biceps, triceps, first dorsal interosseous (FDI), and abductor pollicis brevis (APB).      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   There is no electrophysiologic evidence of a cervical radiculopathy affecting the left upper extremity at this time.     The patient was instructed to follow up with the referring provider for further evaluation and management of the noted findings.    Veda Gerald, DO  Assistant  Professor of Neurology  [place]  [facility]

## 2024-03-30 ENCOUNTER — Encounter (INDEPENDENT_AMBULATORY_CARE_PROVIDER_SITE_OTHER): Payer: Self-pay

## 2024-05-29 ENCOUNTER — Encounter (INDEPENDENT_AMBULATORY_CARE_PROVIDER_SITE_OTHER): Payer: Self-pay | Admitting: Student in an Organized Health Care Education/Training Program

## 2024-07-21 ENCOUNTER — Encounter (INDEPENDENT_AMBULATORY_CARE_PROVIDER_SITE_OTHER): Payer: Self-pay | Admitting: UROLOGY

## 2024-08-13 ENCOUNTER — Ambulatory Visit: Payer: Self-pay | Attending: SLEEP MEDICINE | Admitting: SLEEP MEDICINE

## 2024-08-13 ENCOUNTER — Encounter (HOSPITAL_COMMUNITY): Payer: Self-pay | Admitting: SLEEP MEDICINE

## 2024-08-13 ENCOUNTER — Other Ambulatory Visit: Payer: Self-pay

## 2024-08-13 VITALS — BP 143/78 | HR 67 | Temp 97.9°F | Resp 18 | Ht 66.0 in | Wt 174.0 lb

## 2024-08-13 DIAGNOSIS — F40240 Claustrophobia: Secondary | ICD-10-CM

## 2024-08-13 DIAGNOSIS — F419 Anxiety disorder, unspecified: Secondary | ICD-10-CM | POA: Insufficient documentation

## 2024-08-13 DIAGNOSIS — G4733 Obstructive sleep apnea (adult) (pediatric): Secondary | ICD-10-CM | POA: Insufficient documentation

## 2024-08-13 DIAGNOSIS — G479 Sleep disorder, unspecified: Secondary | ICD-10-CM | POA: Insufficient documentation

## 2024-08-13 DIAGNOSIS — I10 Essential (primary) hypertension: Secondary | ICD-10-CM | POA: Insufficient documentation

## 2024-08-13 DIAGNOSIS — Z9189 Other specified personal risk factors, not elsewhere classified: Secondary | ICD-10-CM

## 2024-08-13 DIAGNOSIS — E119 Type 2 diabetes mellitus without complications: Secondary | ICD-10-CM | POA: Insufficient documentation

## 2024-08-13 DIAGNOSIS — G4719 Other hypersomnia: Secondary | ICD-10-CM | POA: Insufficient documentation

## 2024-08-13 DIAGNOSIS — Z77090 Contact with and (suspected) exposure to asbestos: Secondary | ICD-10-CM

## 2024-08-13 NOTE — H&P (Signed)
 PULMONARY AND SLEEP DISORDERS, [facility]  [address]  [place] [address]  Operated by [facility]     New Patient Sleep Note    Patient Name: Christian Williamson [facility]  Date: 08/13/2024  Department:  PULMONARY AND SLEEP DISORDERS, [facility]  MRN: [id number]  DOB: Jun 04, 1966  Primary Care Provider:  [facility]  Referring Provider:  No ref. provider found    Chief Complaint:   Chief Complaint   Patient presents with    Establish Care     Pt presents to establish sleep care. He was referred to the clinic by the [facility]. He has a history of OSA requiring CPAP and is experiencing difficulty despite trying numerous pressures. [facility].          History of Present Illness  Christian Williamson is a 58 year old male with sleep apnea who presents for a sleep study evaluation. He was referred by the [facility] for a sleep study evaluation.    He has been using a CPAP machine for about a year, initially facing difficulty due to incorrect settings. A new provider adjusted the settings, allowing him to use it for about three hours a night before experiencing a sensation of smothering, leading him to remove the mask during sleep. He experiences significant claustrophobia, which has been somewhat alleviated by switching to a smaller mask that covers only the mouth. Despite these adjustments, he continues to experience poor sleep quality, averaging two to three hours of sleep per night, and feels tired and forgetful during the day. He has undergone multiple sleep studies, but the results have been inconsistent. He reports that his partner hears him snoring at night and that he does not sleep with her because he keeps her awake.    He has a history of prostate cancer diagnosed in June 2023, which required surgical intervention and resulted in significant weight loss from 250 pounds to 160 pounds. He has since regained some weight, currently  weighing 174 pounds. The cancer treatment involved five surgeries, and he experiences ongoing issues with incontinence post-surgery.    He reports a history of a significant fall resulting in knee and shoulder injuries. He sustained torn muscles in the shoulder, which required surgical repair, and has ongoing knee issues, including a torn meniscus and ACL, leading to the use of a cane for ambulation. He underwent physical therapy to regain muscle strength, which was lost during his cancer treatment.    He has been exposed to asbestos and fiberglass in the past, leading to concerns about potential respiratory issues. He experiences shortness of breath and uses a nasal spray that has helped clear his nasal passages, producing black discharge.    He takes medication for PTSD, including Prazosin  1 mg at night, and has a history of using Trazodone, which was recently changed to another medication.     Epworth assessment done in office today with a total score of 18.    Shortness of breath with exertion.      Never smoker.      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 No issue as adult.    History of kidney disease     stones    HTN (hypertension)     controlled with medication    Hyperlipidemia     recently started on crestor     PTSD (post-traumatic stress disorder)     Sleep apnea     Type 2 diabetes mellitus     Wears glasses      Past Surgical History  Past Surgical History:   Procedure Laterality Date    HX CARPAL  TUNNEL RELEASE Bilateral     HX RADICAL PROSTATECTOMY  06/06/2023    HX SHOULDER SURGERY Left     KNEE SURGERY Bilateral     PROSTATE BIOPSY      TESTICLE SURGERY       Medication List  Current Outpatient Medications   Medication Sig    albuterol  sulfate (PROVENTIL  OR VENTOLIN  OR PROAIR ) 90 mcg/actuation Inhalation oral inhaler Take 2 Puffs by inhalation Every 6 hours as needed    busPIRone  (BUSPAR ) 10 mg Oral Tablet Take 1 Tablet (10 mg total) by mouth Three times a day    cholecalciferol, vitamin D3, 50 mcg (2,000 unit) Oral Tablet Take 5 Tablets (10,000 Units total) by mouth Once a day    cyanocobalamin (VITAMIN B 12) 1,000 mcg Oral Tablet Take 1 Tablet (1,000 mcg total) by mouth Once a day    empagliflozin (JARDIANCE) 25 mg Oral Tablet Take 1 Tablet (25 mg total) by mouth Once a day Takes in am    gabapentin  (NEURONTIN ) 400 mg Oral Capsule Take 1 Capsule (400 mg total) by mouth Twice daily    gemfibrozil  (LOPID ) 600 mg Oral Tablet Take 1 Tablet (600 mg total) by mouth Twice a day before meals    lisinopriL -hydrochlorothiazide  (ZESTORETIC ) 20-25 mg Oral Tablet Take 1 Tablet by mouth Once a day Takes in am    MetFORMIN  (GLUCOPHAGE ) 1,000 mg Oral Tablet Take 1 Tablet (1,000 mg total) by mouth Twice daily    prazosin  (MINIPRESS ) 1 mg Oral Capsule 1 Capsule (1 mg total) Every evening    rosuvastatin  (CRESTOR ) 20 mg Oral Tablet Take 1 Tablet (20 mg total) by mouth Once a day Takes in am     Allergy List  Allergy History as of 08/19/24       POISON OAK EXTRACT         Noted Status Severity Type Reaction    05/15/23 1223 Burnard Arland CROME, RN 05/15/23 Active High  Hives/ Urticaria    Comments: Poison Oak                   Family History   Family Medical History:       Problem Relation (Age of Onset)    Blindness Father    Cancer Other    Diabetes Father, Other    Glaucoma Mother            Social History  Social History     Socioeconomic History    Marital status: Divorced     Spouse name: Not on file  Number of  children: Not on file    Years of education: Not on file    Highest education level: Not on file   Occupational History    Not on file   Tobacco Use    Smoking status: Never     Passive exposure: Past    Smokeless tobacco: Never    Tobacco comments:     Tried cigarettes in past   Vaping Use    Vaping status: Never Used   Substance and Sexual Activity    Alcohol use: Not Currently     Comment: no use in past 20 years    Drug use: Never    Sexual activity: Not on file   Other Topics Concern    Abuse/Domestic Violence Not Asked    Caffeine Concern Not Asked    Calcium intake adequate Not Asked    Computer Use Not Asked    Drives Not Asked    Exercise Concern Not Asked    Helmet Use Not Asked    Seat Belt Not Asked    Special Diet Not Asked    Sunscreen used Not Asked    Uses Cane Not Asked    Uses walker Not Asked    Uses wheelchair Not Asked    Right hand dominant Not Asked    Left hand dominant Not Asked    Ambidextrous Not Asked    Shift Work Not Asked    Unusual Sleep-Wake Schedule Not Asked    Ability to Walk 1 Flight of Steps without SOB/CP Yes    Routine Exercise No    Ability to Walk 2 Flight of Steps without SOB/CP Yes    Unable to Ambulate Not Asked    Total Care Not Asked    Ability To Do Own ADL's Yes    Uses Walker No    Other Activity Level Not Asked    Uses Cane No   Social History Narrative    Not on file     Social Determinants of Health     Financial Resource Strain: Not on file   Transportation Needs: Not on file   Social Connections: Low Risk  (06/06/2023)    Social Connections     SDOH Social Isolation: 5 or more times a week   Intimate Partner Violence: Not on file   Housing Stability: Not on file       Review of system  General:  Denies fever, chills, night sweats, loss of appetite.  Neurological:  Denies dizziness or seizures.  Ears, Nose, Throat: Denies ear pain, nasal/sinus congestion or pain, or sore throat.   Gastrointestinal:  Denies uncontrolled reflux, heartburn, nausea, or  diarrhea.  Cardiovascular:  Denies chest pain or irregular heartbeats.  Respiratory: see HPI  Musculoskeletal:  Denies uncontrolled joint pain or restless legs.  Endocrine/Metabolic:  Denies weight gain or weight loss.  Mental Status/Psychiatric:  Denies uncontrolled anxiety or depression.  Integumentary:  Denies rash or new abnormal skin lesions.    Vital Signs  Vitals:    08/13/24 1037   BP: (!) 143/78   Pulse: 67   Resp: 18   Temp: 36.6 C (97.9 F)   TempSrc: Oral   SpO2: 99%   Weight: 78.9 kg (174 lb)   Height: 1.676 m (5' 6)   BMI: 28.08     Neck (Inches): 15.5    Physical Exam  GENERAL APPEARANCE OF THE PT: Alert, no acute distress. Normal appearance, well nourished.  EYES: Normal eye lids. Conjunctiva  normal.  EARS NOSE MOUTH AND THROAT: External inspection of ears and nose with normal appearance. Nares patent.  NECK: Supple with trachea midline, non tender, no nodules, no masses, gland position midline.  RESPIRATORY: Lungs clear to auscultation bilaterally with good air movement. Normal breath sounds, no rales, no rhonchi, no wheezing. Respiratory effort with no retractions, breathing regular and unlabored.  CARDIOVASCULAR: Regular rhythm and regular rate. No murmur, no peripheral edema.  MUSCULOSKELETAL: Normal gait and station, normal digits, no digital cyanosis or clubbing.  MENTAL STATUSPSYCHIATRIC: Alert- oriented to person, place, and time. Appropriate and normal mood.         ICD-10-CM    1. OSA (obstructive sleep apnea)  G47.33 POLYSOMNOGRAPHY - WITH CPAP (MUST BE PRECEDED BY OVERNIGHT (ANP) STUDY      2. Sleep disturbance  G47.9       3. Excessive daytime sleepiness  G47.19       4. Anxiety  F41.9       5. Essential hypertension, benign  I10       6. 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 He uses CPAP for only 3 hours  per night due to discomfort and feelings of smothering. Previous CPAP adjustments have been ineffective. Significant weight loss from 250 lbs to 160 lbs may have impacted CPAP pressure requirements. Home sleep studies are inconclusive, necessitating an in-lab sleep study to assess apnea severity and adjust treatment. BiPAP may be considered if CPAP remains intolerable.  - Schedule in-lab sleep study with split-night protocol to assess apnea severity and adjust PAP therapy.  - Ensure proper mask fitting during the sleep study.  - Instruct to report any discomfort during the sleep study to facilitate potential switch to BiPAP if needed.    Claustrophobia complicating positive airway pressure therapy  Claustrophobia contributes to CPAP intolerance. He feels smothered and removes the mask during sleep. Some improvement noted with a smaller mask covering only the mouth.  - Ensure mask fitting during the sleep study accommodates claustrophobia.  - Consider alternative mask options that minimize claustrophobia symptoms.    Suspected chronic pulmonary disease under evaluation  Reports exposure to asbestos and fiberglass with symptoms of breathlessness. No formal diagnosis of asthma or COPD, but nasal spray use has resulted in significant nasal discharge and improved breathing. Pulmonary evaluation is warranted.  - Request pulmonary evaluation to assess for chronic pulmonary disease.  - Perform breathing tests to evaluate pulmonary function.    Orders Placed This Encounter    POLYSOMNOGRAPHY - WITH CPAP (MUST BE PRECEDED BY OVERNIGHT (ANP) STUDY        Continue medications as prescribed/directed unless changed by provider.  Plan of care discussed with patient.    Return in about 3 months (around 11/13/2024).. Patient was advised to come back earlier if any symptoms get worse.  Also advised to come back for results of any test done.  If unable to keep regular appointment, patient was advised to schedule another appointment as  soon as possible.     The patient was given the opportunity to ask questions and those questions were answered to the patient's satisfaction. The patient was encouraged to call with any additional questions or concerns. Discussed with patient effects and side effects of medications. Medication safety was discussed.  The patient was informed to contact  the office within 7 business days if a message/lab results/referral/imaging results have not been conveyed to the patient.    Electronically signed by Ronal Kerns, FNP-BC   Pulmonary and Critical care    This note was created with assistance from [employer] via capture of conversational audio. Consent was obtained from the patient and all parties present prior to recording.      This note may have been partially generated using MModal Fluency Direct system, and there may be some incorrect words, spellings, and punctuation that were not noted in checking the note before saving.

## 2024-08-19 DIAGNOSIS — E119 Type 2 diabetes mellitus without complications: Secondary | ICD-10-CM | POA: Insufficient documentation

## 2024-08-19 DIAGNOSIS — I10 Essential (primary) hypertension: Secondary | ICD-10-CM | POA: Insufficient documentation

## 2024-08-19 DIAGNOSIS — F419 Anxiety disorder, unspecified: Secondary | ICD-10-CM | POA: Insufficient documentation

## 2024-08-19 DIAGNOSIS — G4733 Obstructive sleep apnea (adult) (pediatric): Secondary | ICD-10-CM | POA: Insufficient documentation

## 2024-08-19 DIAGNOSIS — G4719 Other hypersomnia: Secondary | ICD-10-CM | POA: Insufficient documentation

## 2024-08-19 DIAGNOSIS — G479 Sleep disorder, unspecified: Secondary | ICD-10-CM | POA: Insufficient documentation

## 2024-09-02 ENCOUNTER — Ambulatory Visit (INDEPENDENT_AMBULATORY_CARE_PROVIDER_SITE_OTHER): Payer: Self-pay | Admitting: Physician Assistant

## 2024-09-02 ENCOUNTER — Other Ambulatory Visit: Attending: Physician Assistant | Admitting: Physician Assistant

## 2024-09-02 ENCOUNTER — Encounter (INDEPENDENT_AMBULATORY_CARE_PROVIDER_SITE_OTHER): Payer: Self-pay | Admitting: Physician Assistant

## 2024-09-02 ENCOUNTER — Other Ambulatory Visit: Payer: Self-pay

## 2024-09-02 VITALS — BP 124/76 | HR 74 | Ht 66.0 in | Wt 166.0 lb

## 2024-09-02 DIAGNOSIS — C61 Malignant neoplasm of prostate: Secondary | ICD-10-CM

## 2024-09-02 DIAGNOSIS — Z9889 Other specified postprocedural states: Secondary | ICD-10-CM

## 2024-09-02 DIAGNOSIS — R300 Dysuria: Secondary | ICD-10-CM

## 2024-09-02 DIAGNOSIS — Z8546 Personal history of malignant neoplasm of prostate: Secondary | ICD-10-CM

## 2024-09-02 DIAGNOSIS — N393 Stress incontinence (female) (male): Secondary | ICD-10-CM

## 2024-09-02 DIAGNOSIS — Z9079 Acquired absence of other genital organ(s): Secondary | ICD-10-CM

## 2024-09-02 LAB — URINALYSIS, MICROSCOPIC
RBCS: <1 /HPF (ref <4)
WBCS: <1 /HPF (ref <6)

## 2024-09-02 NOTE — Progress Notes (Signed)
 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 He had negative margins. He has stress incontinence with around 10-12 leaking episodes per day but this is slowly improving. He is doing daily kegels. He reports sporadic intermittent bladder spasms. He states these have also been improving daily.     He presents today follow up. His recent PSA is undetectable. He continues to have stress urinary incontinence. He reports intermittent urgency. He reports his Ha1c as 6.9 and is taking Jardiance. He reports intermittent dysuria.He denies fever, chills, suprapubic pain, nausea, vomiting, flank pain, hematuria.    02/26/24    PSA <0.01  10/22/2023: PSA <0.01    PROSTATE SPECIFIC ANTIGEN   Lab Results   Component Value Date/Time    PROSSPECAG 9.91 (H) 05/01/2023 03:18 PM                Objective   Objective :  BP 124/76   Pulse 74   Ht 1.676 m (5' 6)   Wt 75.3 kg (166 lb)   BMI 26.79 kg/m       Gen: NAD, alert  Pulm: unlabored at rest  CV: palpable pulses  Abd: soft, Nt/ND  GU: no suprapubic tenderness, no CVAT      Data reviewed:    Current Outpatient Medications   Medication Sig    albuterol  sulfate (PROVENTIL  OR VENTOLIN  OR PROAIR ) 90 mcg/actuation Inhalation oral inhaler Take 2 Puffs by inhalation Every 6 hours as needed    busPIRone  (BUSPAR ) 10 mg Oral Tablet Take 1 Tablet (10 mg total) by mouth Three times a day    cholecalciferol, vitamin D3, 50 mcg (2,000 unit) Oral Tablet Take 5 Tablets (10,000 Units total) by mouth Once a day    cyanocobalamin (VITAMIN B 12) 1,000 mcg Oral Tablet  Take 1 Tablet (1,000 mcg total) by mouth Once a day    empagliflozin (JARDIANCE) 25 mg Oral Tablet Take 1 Tablet (25 mg total) by mouth Once a day Takes in am    gabapentin  (NEURONTIN ) 400 mg Oral Capsule Take 1 Capsule (400 mg total) by mouth Twice daily    gemfibrozil  (LOPID ) 600 mg Oral Tablet Take 1 Tablet (600 mg total) by mouth Twice a day before meals    lisinopriL -hydrochlorothiazide  (ZESTORETIC ) 20-25 mg Oral Tablet Take 1 Tablet by mouth Once a day Takes in am    MetFORMIN  (GLUCOPHAGE ) 1,000 mg Oral Tablet Take 1 Tablet (1,000 mg total) by mouth Twice daily    prazosin  (MINIPRESS ) 1 mg Oral Capsule 1 Capsule (1 mg total) Every evening    rosuvastatin  (CRESTOR ) 20 mg Oral Tablet Take 1 Tablet (20 mg total) by mouth Once a day Takes in am        Assessment/Plan  Problem List Items Addressed This Visit    None      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          A combined total of 20 minutes were spent preparing to see the patient, reviewing previous records, ordering tests/medications/procedures, documenting the clinical encounter as well as performing a medically appropriate evaluation and independently interpreting results and communicating them to the patient/family/caregiver as specifically outlined above in the impression and plan.

## 2024-09-08 ENCOUNTER — Ambulatory Visit
Admission: RE | Admit: 2024-09-08 | Discharge: 2024-09-08 | Disposition: A | Discharge status: Home / Self Care | Payer: Self-pay | Source: Ambulatory Visit | Attending: SLEEP MEDICINE | Admitting: SLEEP MEDICINE

## 2024-09-08 ENCOUNTER — Other Ambulatory Visit: Payer: Self-pay

## 2024-09-08 DIAGNOSIS — G4710 Hypersomnia, unspecified: Secondary | ICD-10-CM

## 2024-09-08 DIAGNOSIS — G4733 Obstructive sleep apnea (adult) (pediatric): Secondary | ICD-10-CM | POA: Insufficient documentation

## 2024-09-08 DIAGNOSIS — R0683 Snoring: Secondary | ICD-10-CM

## 2024-09-09 NOTE — Procedures (Signed)
 [facility]    Sleep Study    Patient Name: Christian Williamson  Date of Birth: 1966/06/28  Gender: male  Provider: Manya Motto DO  Location: [facility]  Location Address: [address] [place], [address]  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 No issue as adult.    History of kidney disease     stones    HTN (hypertension)     controlled with medication    Hyperlipidemia     recently started on crestor     PTSD (post-traumatic stress disorder)     Sleep apnea     Type 2 diabetes mellitus     Wears glasses            Past Surgical History  Past Surgical History:   Procedure Laterality Date    HX CARPAL TUNNEL RELEASE Bilateral     HX RADICAL PROSTATECTOMY  06/06/2023    HX SHOULDER SURGERY Left     KNEE SURGERY Bilateral     PROSTATE BIOPSY      TESTICLE SURGERY             Medication LIst  No outpatient medications have been marked as taking for the 09/08/24 encounter [facility] Encounter) with SLEEP LAB BED 1 PRN.       Allergy List  Allergies[1]    Family Medical History  Family Medical History:       Problem Relation (Age of Onset)    Blindness Father    Cancer Other    Diabetes Father, Other    Glaucoma Mother              Social History  Social History     Socioeconomic History    Marital status: Divorced   Tobacco Use    Smoking status: Never     Passive exposure: Past    Smokeless tobacco: Never    Tobacco comments:     Tried cigarettes in past   Vaping Use    Vaping status: Never Used   Substance and Sexual Activity    Alcohol use: Not  Currently     Comment: no use in past 20 years    Drug use: Never   Other Topics Concern    Ability to Walk 1 Flight of Steps without SOB/CP Yes    Routine Exercise No    Ability to Walk 2 Flight of Steps without SOB/CP Yes    Ability To Do Own ADL's Yes    Uses Walker No    Uses Cane No     Social Determinants of Health     Social Connections: Low Risk (06/06/2023)    Social Connections     SDOH Social Isolation: 5 or more times a week       Patient Information  Sleep apnea symptoms: Excessive Daytime Sleepiness and  Snoring  Patient's Height: 66  Patient's Weight: 164  Patient's BMI: 26.5  Neck Circumference: 16.5  Epworth Scale Score: 18    Date of Study: 09/08/2024  Study Performed By: C.Hall,RRT,RPSGT   Study Reviewed By: [facility]    Procedure : Procedure: Complete PSG with digital sleep system using the international 10-20 electrode placement for recording EEG, EOG, EMG from chin, ECG, Respiratory effort, oximetry, body position, airflow, snoring sound, pulse rate, and limb movement channels.    Sleep Architecture:   Total Sleep Time: 454.0 min.   Sleep Efficiency Percentage: 91.6%.   Patient Sleep Latency (Minutes): 14.9 min.   Patient REM Latency (Minutes): 136.0 min.   Percentage of Stage I Sleep:  1.1 %.   Percentage of Stage II Sleep: 37.8 %.   Percentage of Stage III Sleep: 43.4 %.   Percentage (%) of REM sleep: 17.7 %.     Respiratory Data:   Number of Obstructive Apneas Observed: 1 .   Number of Central Apneas Observed: 0.   Number of Mixed Apneas Observed: 0.   Total Number of Apneas: 1.   Total number of Hypopneas: 17.   Snoring During the Study: Yes, severe    Apnea Hypopnea Index (AHI) per hour: 2.4.   Percent of Time Patient Spent in Supine Position: 97.8 %.  AHI in Supine Position #: 6.8.   Non-supine AHI #: 0.7.   REM AHI #: 2.2.   Non-REM AHI #:  2.4.   Lowest Desaturation Percentage: 88%.   Total Amount of Desaturated Time Below or Equal to 0.0 .      Cardiac Data:   The Mean Heart  Rate During the Study (Beats/Min): 63.2.   Sinus Rhythm: Normal     Periodic Leg Movements:   Total Number of Periodic Leg Movements During the Study: 16.   PLM Index #: 2.1.    The study was scored by: 30 second EPOCH, 4 % desaturation and  CMS guidelines.AASM Accredited     Assessment:   Diagnosis:   (G47.10) Hypersomnia ([id number]) and (R06.83) Snoring (SCT)    PLAN  Procedure Orders  No significant OSA.    Instructions:   Positional Therapy may be helpful in snoring and Nasal steroids may be helpful for primary snoring    Impression  Ahi 2.4 per hour and no significant OSA  All sleep stages achieved.  May need evaluation for MSLT study.  Powell Sic, LPN        Pulmonary Attending:  I have personally reviewed the raw data for the sleep study and  I agree with the assessment and plan as above.    Scoring seem appropriate.   No significant sleep apnea identified on current in-lab sleep study  May consider evaluation with MSLT if symptoms persist  Will need to follow up in the office to review face to face within 90 days.     Bernardino LITTIE Hoyle, DO  09/24/2024 17:48           [1]   Allergies  Allergen Reactions    Poison [facility] Extract Hives/ Urticaria     Poison [place]

## 2024-09-29 ENCOUNTER — Ambulatory Visit: Payer: Self-pay | Attending: SLEEP MEDICINE | Admitting: SLEEP MEDICINE

## 2024-09-29 ENCOUNTER — Encounter (HOSPITAL_COMMUNITY): Payer: Self-pay | Admitting: SLEEP MEDICINE

## 2024-09-29 ENCOUNTER — Other Ambulatory Visit: Payer: Self-pay

## 2024-09-29 VITALS — BP 141/81 | HR 62 | Temp 97.7°F | Resp 15 | Ht 66.0 in | Wt 167.0 lb

## 2024-09-29 DIAGNOSIS — G4719 Other hypersomnia: Secondary | ICD-10-CM | POA: Insufficient documentation

## 2024-09-29 DIAGNOSIS — I10 Essential (primary) hypertension: Secondary | ICD-10-CM | POA: Insufficient documentation

## 2024-09-29 DIAGNOSIS — E119 Type 2 diabetes mellitus without complications: Secondary | ICD-10-CM | POA: Insufficient documentation

## 2024-09-29 DIAGNOSIS — R5382 Chronic fatigue, unspecified: Secondary | ICD-10-CM | POA: Insufficient documentation

## 2024-09-29 DIAGNOSIS — F419 Anxiety disorder, unspecified: Secondary | ICD-10-CM | POA: Insufficient documentation

## 2024-09-29 DIAGNOSIS — G4710 Hypersomnia, unspecified: Secondary | ICD-10-CM | POA: Insufficient documentation

## 2024-09-29 DIAGNOSIS — C61 Malignant neoplasm of prostate: Secondary | ICD-10-CM | POA: Insufficient documentation

## 2024-09-29 NOTE — Progress Notes (Signed)
 PULMONARY AND SLEEP DISORDERS, [facility]  [address]  [place] [address]  Operated by [facility]     Follow up Sleep Note    Patient Name: Christian Williamson  Date: 09/29/2024  Department:  PULMONARY AND SLEEP DISORDERS, [facility]  MRN: [id number]  DOB: 1966/01/08  Primary Care Provider:  [facility]  Referring Provider:  No ref. provider found      Chief Complaint:   Chief Complaint   Patient presents with    Follow Up      Pt presents for a sleep follow up. Currently on a CPAP through the [place]. He had a CPAP titration on 9/23.          History of Present Illness  Christian Williamson is a 58 year old male who presents for a follow-up on his sleep study and evaluation of chronic fatigue.    He experiences significant chronic fatigue, which he describes as his 'major, major issue.' This fatigue has been present for a long time and worsened after his prostate cancer diagnosis. He experiences excessive daytime sleepiness, often falling asleep while watching TV, but remains alert while driving due to the need to stay focused. No sleepiness while driving.  He attributes his fatigue to PTSD and anxiety.    He has a history of prostate cancer and he attributes his fatigue worsening after this diagnosis. He has experienced significant weight loss, dropping from 250 pounds to 160 pounds, and notes a recent further weight loss of about 10 pounds, bringing him down to 167 pounds. His son often comments on his reduced food intake.    He has a history of exposure to environmental hazards during his time in the [place], including oil smoke, diesel fumes, and asbestos. 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    He has been using a CPAP machine for 12 to 15 years but reports not wearing it recently due to weight loss and a recent sleep study indicating he does not qualify  for CPAP use. He mentions a previous incident where he fell down 21 steps, resulting in a head injury and scar tissue, which causes him pain.    He has PTSD and anxiety, which contribute to his alertness while driving. He describes his experiences in the [place], including exposure to potential advertising account executive and the stress of his work environment. He has not been treated with stimulants for his fatigue.     Epworth assessment done in office today with a total score of 14.    Denies shortness of breath    Never smoker.      Results  DIAGNOSTIC  Sleep study: No CPAP required, rapid REM onset, hypersomnia. Recommend MSLT testing.     Polysomnography results reviewed from 09/08/24 and results showing desaturation of oxygen with lowest at 88 % And staying at or below 88% for 0 Minutes.  Patient shown to have no significant obstructive sleep apnea (OSA) with AHI at 2.4/hour.      Past Medical History:  Past Medical History:   Diagnosis Date    Anxiety     Asbestos exposure     Cancer (CMS HCC)     Prostate    CPAP (continuous positive airway pressure) dependence     Diabetes     Diabetes mellitus, type 2     H/O hearing loss  almost deaf in left ear, bilat hearing aids    Heart murmur     as child. No issue as adult.    History of kidney disease     stones    HTN (hypertension)     controlled with medication    Hyperlipidemia     recently started on crestor     PTSD (post-traumatic stress disorder)     Sleep apnea     Type 2 diabetes mellitus     Wears glasses      Past Surgical History  Past Surgical History:   Procedure Laterality Date    HX CARPAL TUNNEL RELEASE Bilateral     HX RADICAL PROSTATECTOMY  06/06/2023    HX SHOULDER SURGERY Left     KNEE SURGERY Bilateral     PROSTATE BIOPSY      TESTICLE SURGERY       Medication List  Current Outpatient Medications   Medication Sig    albuterol  sulfate (PROVENTIL  OR VENTOLIN  OR PROAIR ) 90 mcg/actuation Inhalation oral inhaler Take 2 Puffs by inhalation Every 6  hours as needed    busPIRone  (BUSPAR ) 10 mg Oral Tablet Take 1 Tablet (10 mg total) by mouth Three times a day    cholecalciferol, vitamin D3, 50 mcg (2,000 unit) Oral Tablet Take 5 Tablets (10,000 Units total) by mouth Once a day    cyanocobalamin (VITAMIN B 12) 1,000 mcg Oral Tablet Take 1 Tablet (1,000 mcg total) by mouth Once a day    empagliflozin (JARDIANCE) 25 mg Oral Tablet Take 1 Tablet (25 mg total) by mouth Once a day Takes in am    gabapentin  (NEURONTIN ) 400 mg Oral Capsule Take 1 Capsule (400 mg total) by mouth Twice daily    gemfibrozil  (LOPID ) 600 mg Oral Tablet Take 1 Tablet (600 mg total) by mouth Twice a day before meals    lisinopriL -hydrochlorothiazide  (ZESTORETIC ) 20-25 mg Oral Tablet Take 1 Tablet by mouth Once a day Takes in am    MetFORMIN  (GLUCOPHAGE ) 1,000 mg Oral Tablet Take 1 Tablet (1,000 mg total) by mouth Twice daily    prazosin  (MINIPRESS ) 1 mg Oral Capsule 1 Capsule (1 mg total) Every evening    rosuvastatin  (CRESTOR ) 20 mg Oral Tablet Take 1 Tablet (20 mg total) by mouth Once a day Takes in am     Allergy List  Allergy History as of 10/10/24       POISON [place] EXTRACT         Noted Status Severity Type Reaction    05/15/23 1223 Burnard Arland CROME, RN 05/15/23 Active High  Hives/ Urticaria    Comments: Poison [place]                   Family History   Family Medical History:       Problem Relation (Age of Onset)    Blindness Father    Cancer Other    Diabetes Father, Other    Glaucoma Mother            Social History  Social History     Socioeconomic History    Marital status: Divorced   Tobacco Use    Smoking status: Never     Passive exposure: Past    Smokeless tobacco: Never    Tobacco comments:     Tried cigarettes in past   Vaping Use    Vaping status: Never Used   Substance and Sexual Activity    Alcohol use: Not Currently  Comment: no use in past 20 years    Drug use: Never   Other Topics Concern    Ability to Walk 1 Flight of Steps without SOB/CP Yes    Routine Exercise No     Ability to Walk 2 Flight of Steps without SOB/CP Yes    Ability To Do Own ADL's Yes    Uses Walker No    Uses Cane No     Social Determinants of Health     Social Connections: Low Risk (06/06/2023)    Social Connections     SDOH Social Isolation: 5 or more times a week        Review of system  General:  Denies fever, chills, night sweats, loss of appetite.  Neurological:  Denies dizziness or seizures.  Ears, Nose, Throat: Denies ear pain, nasal/sinus congestion or pain, or sore throat.   Gastrointestinal:  Denies uncontrolled reflux, heartburn, nausea, or diarrhea.  Cardiovascular:  Denies chest pain or irregular heartbeats.  Respiratory: see HPI  Musculoskeletal:  Denies uncontrolled joint pain or restless legs.  Endocrine/Metabolic:  Denies weight gain or weight loss.  Mental Status/Psychiatric:  Denies uncontrolled anxiety or depression.  Integumentary:  Denies rash or new abnormal skin lesions.    Vital Signs  Vitals:    09/29/24 0728   BP: (!) 141/81   Pulse: 62   Resp: 15   Temp: 36.5 C (97.7 F)   TempSrc: Oral   SpO2: 98%   Weight: 75.8 kg (167 lb)   Height: 1.676 m (5' 6)   BMI: 26.95          Physical Exam  MEASUREMENTS: Weight- 167.  GENERAL APPEARANCE OF THE PT: Alert, no acute distress. Normal appearance, well nourished.  EYES: Normal eye lids. Conjunctiva normal.  EARS NOSE MOUTH AND THROAT: External inspection of ears and nose with normal appearance. Nares patent.  NECK: Supple with trachea midline, non tender, no nodules, no masses, gland position midline.  RESPIRATORY: Lungs clear to auscultation bilaterally. No rales, no rhonchi, no wheezing. Respiratory effort with no tractions, breathing regular and unlabored.  CARDIOVASCULAR: Regular rhythm and regular rate. No murmur, no peripheral edema.  MUSCULOSKELETAL: Normal gait and station, normal digits, no digital cyanosis or clubbing.  MENTAL STATUSPSYCHIATRIC: Alert- oriented to person, place, and time. Appropriate and normal mood.         ICD-10-CM     1. Excessive daytime sleepiness  G47.19 POLYSOMNOGRAPHY-ANP OVERNIGHT-1ST NIGHT OF STUDY (F/U WITH CPAP IF ANP MEETS CLINICAL CRITERIA)     POLYSOMNOGRAPHY - MULTIPLE SLEEP LATENCY TEST      2. Hypersomnia  G47.10 POLYSOMNOGRAPHY-ANP OVERNIGHT-1ST NIGHT OF STUDY (F/U WITH CPAP IF ANP MEETS CLINICAL CRITERIA)     POLYSOMNOGRAPHY - MULTIPLE SLEEP LATENCY TEST      3. Chronic fatigue  R53.82 POLYSOMNOGRAPHY-ANP OVERNIGHT-1ST NIGHT OF STUDY (F/U WITH CPAP IF ANP MEETS CLINICAL CRITERIA)     POLYSOMNOGRAPHY - MULTIPLE SLEEP LATENCY TEST      4. Essential hypertension, benign  I10       5. Type 2 diabetes mellitus  E11.9       6. Prostate cancer (CMS HCC)  C61       7. 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 History of exposure to environmental toxins during [place], which may contribute to fatigue. Symptoms worsened after prostate cancer  diagnosis and treatment. Significant impact on daily life, including falling asleep during sedentary activities but remains alert while driving due to anxiety and PTSD.  - Order narcolepsy testing with 24-hour sleep study, pending [facility] authorization.    Obstructive sleep apnea (resolved)  Previously managed with CPAP. Recent sleep study indicates no current need for CPAP as he did not meet criteria for obstructive sleep apnea during the study. No oxygen requirement at night. Continued CPAP use could potentially worsen apnea symptoms.  - Discontinue CPAP use as it is not needed.    Orders Placed This Encounter    POLYSOMNOGRAPHY-ANP OVERNIGHT-1ST NIGHT OF STUDY (F/U WITH CPAP IF ANP MEETS CLINICAL CRITERIA)    POLYSOMNOGRAPHY - MULTIPLE SLEEP LATENCY TEST       Continue medications as prescribed/directed unless changed by provider.  Plan of care discussed with patient.    Return for after all testing.. Patient was advised to come back earlier if any  symptoms get worse.  Also advised to come back for results of any test done.  If unable to keep regular appointment, patient was advised to schedule another appointment as soon as possible.     The patient was given the opportunity to ask questions and those questions were answered to the patient's satisfaction. The patient was encouraged to call with any additional questions or concerns. Discussed with the patient effects and side effects of medications. Medication safety was discussed.  The patient was informed to contact the office within 7 business days if a message/lab results/referral/imaging results have not been conveyed to the patient.    Electronically signed by Ronal Kerns, APRN, CNP   Pulmonary and Critical care    This note was created with assistance from [employer] via capture of conversational audio. Consent was obtained from the patient and all parties present prior to recording.      This note may have been partially generated using MModal Fluency Direct system, and there may be some incorrect words, spellings, and punctuation that were not noted in checking the note before saving.

## 2024-10-27 ENCOUNTER — Ambulatory Visit (HOSPITAL_COMMUNITY): Payer: Self-pay | Admitting: SLEEP MEDICINE

## 2024-12-01 ENCOUNTER — Other Ambulatory Visit: Payer: Self-pay

## 2024-12-01 ENCOUNTER — Ambulatory Visit
Admission: RE | Admit: 2024-12-01 | Discharge: 2024-12-01 | Disposition: A | Payer: Self-pay | Source: Ambulatory Visit | Attending: SLEEP MEDICINE

## 2024-12-01 ENCOUNTER — Other Ambulatory Visit (HOSPITAL_COMMUNITY): Payer: Self-pay | Admitting: SLEEP MEDICINE

## 2024-12-01 DIAGNOSIS — G4719 Other hypersomnia: Secondary | ICD-10-CM | POA: Insufficient documentation

## 2024-12-01 DIAGNOSIS — G4710 Hypersomnia, unspecified: Secondary | ICD-10-CM

## 2024-12-01 DIAGNOSIS — R5382 Chronic fatigue, unspecified: Secondary | ICD-10-CM | POA: Insufficient documentation

## 2024-12-02 ENCOUNTER — Ambulatory Visit
Admission: RE | Admit: 2024-12-02 | Discharge: 2024-12-02 | Disposition: A | Payer: Self-pay | Source: Ambulatory Visit | Attending: SLEEP MEDICINE

## 2024-12-02 DIAGNOSIS — R5382 Chronic fatigue, unspecified: Secondary | ICD-10-CM | POA: Insufficient documentation

## 2024-12-02 DIAGNOSIS — G4710 Hypersomnia, unspecified: Secondary | ICD-10-CM | POA: Insufficient documentation

## 2024-12-02 DIAGNOSIS — G4719 Other hypersomnia: Secondary | ICD-10-CM

## 2024-12-02 LAB — DRUG SCREEN, NO CONFIRMATION, URINE
AMPHETAMINES URINE: NEGATIVE
BARBITURATES URINE: NEGATIVE
BENZODIAZEPINES URINE: NEGATIVE
BUPRENORPHINE URINE: NEGATIVE
CANNABINOIDS URINE: NEGATIVE
COCAINE METABOLITES URINE: NEGATIVE
FENTANYL, URINE: NEGATIVE
METHADONE URINE: NEGATIVE
OPIATES URINE: NEGATIVE
OXYCODONE URINE: NEGATIVE
PCP URINE: NEGATIVE

## 2024-12-02 NOTE — Procedures (Signed)
 [facility]    Sleep Study    Patient Name: Christian Williamson  Date of Birth: March 21, 1966  Gender: male  Provider: Bernardino Hoyle, DO  Location: [facility]  Location Address: 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 No issue as adult.    History of kidney disease     stones    HTN (hypertension)     controlled with medication    Hyperlipidemia     recently started on crestor     PTSD (post-traumatic stress disorder)     Sleep apnea     Type 2 diabetes mellitus     Wears glasses            Past Surgical History  Past Surgical History:   Procedure Laterality Date    HX CARPAL TUNNEL RELEASE Bilateral     HX RADICAL PROSTATECTOMY  06/06/2023    HX SHOULDER SURGERY Left     KNEE SURGERY Bilateral     PROSTATE BIOPSY      TESTICLE SURGERY             Medication LIst  No outpatient medications have been marked as taking for the 12/02/24 encounter [facility] Encounter) with SLEEP LAB BED 1 PRN.       Allergy List  Allergies[1]    Family Medical History  Family Medical History:       Problem Relation (Age of Onset)    Blindness Father    Cancer Other    Diabetes Father, Other    Glaucoma Mother              Social History  Social History     Socioeconomic History    Marital status: Divorced   Tobacco Use    Smoking status: Never     Passive exposure: Past    Smokeless tobacco: Never    Tobacco comments:     Tried cigarettes in past   Vaping Use    Vaping status: Never Used   Substance and Sexual Activity     Alcohol use: Not Currently     Comment: no use in past 20 years    Drug use: Never   Other Topics Concern    Ability to Walk 1 Flight of Steps without SOB/CP Yes    Routine Exercise No    Ability to Walk 2 Flight of Steps without SOB/CP Yes    Ability To Do Own ADL's Yes    Uses Walker No    Uses Cane No     Social Determinants of Health     Social Connections: Low Risk (06/06/2023)    Social Connections     SDOH Social Isolation: 5 or more times a week       Patient Information  Sleep  apnea symptoms: Excessive Daytime Sleepiness  Patient's Height: 66.0  Patient's Weight: 164.0  Patient's BMI: 26.5  Neck Circumference: 16.5  Epworth Scale Score: 18    Date of Study: 12/01/2024  Study Performed By: C. Shona, RRT, RPSGT   Study Reviewed By: C. Shona, RRT, RPSGT    Procedure : Procedure: Complete PSG with digital sleep system using the international 10-20 electrode placement for recording EEG, EOG, EMG from chin, ECG, Respiratory effort, oximetry, body position, airflow, snoring sound, pulse rate, and limb movement channels.    Sleep Architecture:   Total Recording Time: 424.5 min.  Total Sleep Time: 328.0 min.   Sleep Efficiency Percentage: 67.0%.   Patient Sleep Latency (Minutes): 43.9 min.   Patient REM Latency (Minutes): 98.5 min.   Percentage of Stage I Sleep:  3.7 %.   Percentage of Stage II Sleep: 24.1 %.   Percentage of Stage III Sleep: 43.4 %.   Percentage (%) of REM sleep: 28.8 %.     Respiratory Data:   Number of Obstructive Apneas Observed: 0.0 .   Number of Central Apneas Observed: 0.0.   Number of Mixed Apneas Observed: 0.0.   Total Number of Apneas: 0.0.   Total number of Hypopneas: 10.   Snoring During the Study: Yes, mild    Apnea Hypopnea Index (AHI) per hour: 1.8.   Time Patient Spent in Supine Position: 157.9 min..  AHI in Supine Position #: 4.8  Non-supine AHI #: 0.5.   REM AHI #: 1.8.   Non-REM AHI #:  3.8.   Lowest Desaturation Percentage: 91%.   Total Amount of Desaturated Time Below or Equal to  0.0 .        Cardiac Data:   The Mean Heart Rate During the Study (Beats/Min): 66.1.   Sinus Rhythm: Normal     Periodic Leg Movements:   Total Number of Periodic Leg Movements During the Study: 0.0.   PLM Index #: 0.0.    The study was scored by: 30 second EPOCH, 4 % desaturation and  CMS guidelines.AASM Accredited     Assessment:   Diagnosis:   (R06.83) Snoring (SCT)    PLAN  Procedure Orders  Staying over for an MSLT.     Instructions:   Positional Therapy may be helpful in snoring and Nasal steroids may be helpful for primary snoring    Impression  Ahi 1.8 per hour and No significant  OSA  No stages of sleep were achieved.   Sleep efficiency 67.0%.   Patient staying for an MSLT.    Aneta Charlies Mulders, RRT, RPSGT, [facility]      Pulmonary Attending:  I have personally reviewed the raw data for the sleep study and  I agree with the assessment and plan as above.    Scoring seem appropriate.   No significant sleep apnea appreciated on current study   We will plan for MSLT  Will need to follow up in the office to review face to face within 90 days.     Bernardino LITTIE Hoyle, DO  12/07/2024 18:56             [1]   Allergies  Allergen Reactions    Poison [facility] Extract Hives/ Urticaria     Poison [place]

## 2024-12-02 NOTE — Procedures (Signed)
 [facility]    MSLT Study    Patient Name: Christian Williamson  Date of Birth: Aug 03, 1966  Gender: male  Provider: Bernardino Hoyle, DO  Location: [facility]  Location Address: [address] 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 No issue as adult.    History of kidney disease     stones    HTN (hypertension)     controlled with medication    Hyperlipidemia     recently started on crestor     PTSD (post-traumatic stress disorder)     Sleep apnea     Type 2 diabetes mellitus     Wears glasses            Past Surgical History  Past Surgical History:   Procedure Laterality Date    HX CARPAL TUNNEL RELEASE Bilateral     HX RADICAL PROSTATECTOMY  06/06/2023    HX SHOULDER SURGERY Left     KNEE SURGERY Bilateral     PROSTATE BIOPSY      TESTICLE SURGERY             Medication LIst  No outpatient medications have been marked as taking for the 12/02/24 encounter [facility] Encounter) with SLEEP LAB BED 1 PRN.       Allergy List  Allergies[1]    Family Medical History  Family Medical History:       Problem Relation (Age of Onset)    Blindness Father    Cancer Other    Diabetes Father, Other    Glaucoma Mother              Social History  Social History     Socioeconomic History    Marital status: Divorced   Tobacco Use    Smoking status: Never     Passive exposure: Past    Smokeless tobacco: Never    Tobacco comments:     Tried cigarettes in past   Vaping Use    Vaping status: Never Used   Substance and Sexual Activity    Alcohol use: Not  Currently     Comment: no use in past 20 years    Drug use: Never   Other Topics Concern    Ability to Walk 1 Flight of Steps without SOB/CP Yes    Routine Exercise No    Ability to Walk 2 Flight of Steps without SOB/CP Yes    Ability To Do Own ADL's Yes    Uses Walker No    Uses Cane No     Social Determinants of Health     Social Connections: Low Risk (06/06/2023)    Social Connections     SDOH Social Isolation: 5 or more times a week             Patient Information  Sleep  apnea symptoms: Excessive Daytime Sleepiness and Snoring  Epworth Scale Score: 18    Date of Study: 12/02/2024  Study Performed By: SHAUNNA. Prater, LPN   Study Reviewed By: Izetta Mulders, RT, RPSGT, CCS[facility]    Procedure : Procedure: MSLT consisted of four nap opportunities occurred following a nocturnal polysomnogram.    Sleep Architecture: Patient achieved sleep during first nap: Yes., Patient achieved sleep during second nap: Yes, Patient achieved sleep during third nap: Yes., Patient achieved sleep during fourth nap: Yes., Out of four sleep opportunities, REM sleep was achieved in: 0 sleep opportunities. , and Mean Sleep Latency 00:05:30    Cardiac Data: The Mean Heart Rate During the Study (Beats/Min): 66.1.   Sinus Rhythm: Normal.    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    Scoring seem appropriate.   .  Patient achieved sleep in 4/4 naps however no REM was appreciated   Findings are not consistent with narcolepsy however they are consistent with idiopathic hypersomnolence  Will need to follow up in  the office to review face to face within 90 days.     Bernardino LITTIE Hoyle, DO  12/07/2024 19:00             [1]   Allergies  Allergen Reactions    Poison [facility] Extract Hives/ Urticaria     Poison [place]

## 2024-12-07 DIAGNOSIS — R0683 Snoring: Secondary | ICD-10-CM

## 2024-12-07 DIAGNOSIS — G4710 Hypersomnia, unspecified: Secondary | ICD-10-CM

## 2025-09-01 ENCOUNTER — Encounter (INDEPENDENT_AMBULATORY_CARE_PROVIDER_SITE_OTHER): Payer: Self-pay | Admitting: Physician Assistant
